# Patient Record
Sex: Female | Born: 1980 | Race: White | Hispanic: No | Marital: Married | Smoking: Never smoker
Health system: Southern US, Community
[De-identification: ages and names within clinical notes are randomized; demographics above are authoritative.]

## PROBLEM LIST (undated history)

## (undated) DIAGNOSIS — I491 Atrial premature depolarization: Secondary | ICD-10-CM

## (undated) DIAGNOSIS — R002 Palpitations: Secondary | ICD-10-CM

## (undated) DIAGNOSIS — F411 Generalized anxiety disorder: Secondary | ICD-10-CM

## (undated) DIAGNOSIS — I493 Ventricular premature depolarization: Secondary | ICD-10-CM

## (undated) DIAGNOSIS — E079 Disorder of thyroid, unspecified: Secondary | ICD-10-CM

## (undated) DIAGNOSIS — A609 Anogenital herpesviral infection, unspecified: Secondary | ICD-10-CM

## (undated) DIAGNOSIS — Z789 Other specified health status: Secondary | ICD-10-CM

## (undated) DIAGNOSIS — Z8619 Personal history of other infectious and parasitic diseases: Secondary | ICD-10-CM

## (undated) HISTORY — DX: Atrial premature depolarization: I49.1

## (undated) HISTORY — DX: Ventricular premature depolarization: I49.3

## (undated) HISTORY — DX: Generalized anxiety disorder: F41.1

## (undated) HISTORY — DX: Palpitations: R00.2

---

## 2013-01-08 ENCOUNTER — Ambulatory Visit: Payer: BC Managed Care – PPO | Admitting: Physician Assistant

## 2013-01-08 VITALS — BP 112/60 | HR 61 | Temp 98.0°F | Resp 16 | Ht 62.0 in | Wt 125.0 lb

## 2013-01-08 DIAGNOSIS — R350 Frequency of micturition: Secondary | ICD-10-CM

## 2013-01-08 DIAGNOSIS — R300 Dysuria: Secondary | ICD-10-CM

## 2013-01-08 LAB — POCT URINALYSIS DIPSTICK
Bilirubin, UA: NEGATIVE
Glucose, UA: NEGATIVE
Ketones, UA: NEGATIVE
Nitrite, UA: NEGATIVE
Protein, UA: NEGATIVE
Spec Grav, UA: 1.01
Urobilinogen, UA: 0.2
pH, UA: 6.5

## 2013-01-08 LAB — POCT UA - MICROSCOPIC ONLY
Crystals, Ur, HPF, POC: NEGATIVE
Mucus, UA: NEGATIVE
Yeast, UA: NEGATIVE

## 2013-01-08 MED ORDER — PHENAZOPYRIDINE HCL 200 MG PO TABS: 200.0000 mg | ORAL_TABLET | Freq: Three times a day (TID) | ORAL | Status: DC | PRN

## 2013-01-08 MED ORDER — CIPROFLOXACIN HCL 500 MG PO TABS: 500.0000 mg | ORAL_TABLET | Freq: Two times a day (BID) | ORAL | Status: DC

## 2013-01-08 NOTE — Progress Notes (Signed)
Patient ID: Ashley Mendez MRN: [id number], DOB: [birth year], 32 y.o. Date of Encounter: 01/08/2013, 8:50 AM  Primary Physician: No primary provider on file.  Chief Complaint: urinary frequency and dysuria  HPI: 32 y.o. year old female with presents with 1 day history of urinary frequency, dysuria, suprapubic pressure, and slight nausea. Symptoms started acutely in the middle of the night last night and have progressively worsened. Denies flank pain, vomiting, fever, chills, vaginal discharge, or odor. Has tried cranberry juice and ibuprofen which has helped some. LNMP 12/30/12. Patient is otherwise doing well without issues or complaints.  History reviewed. No pertinent past medical history.   Home Meds: Prior to Admission medications   Medication Sig Start Date End Date Taking? Authorizing Provider  etonogestrel-ethinyl estradiol (NUVARING) 0.12-0.015 MG/24HR vaginal ring Place 1 each vaginally every 28 (twenty-eight) days. Insert vaginally and leave in place for 3 consecutive weeks, then remove for 1 week.   Yes Historical Provider, MD  ciprofloxacin (CIPRO) 500 MG tablet Take 1 tablet (500 mg total) by mouth 2 (two) times daily. 01/08/13   Nelva Nay, PA-C  phenazopyridine (PYRIDIUM) 200 MG tablet Take 1 tablet (200 mg total) by mouth 3 (three) times daily as needed for pain. 01/08/13   Nelva Nay, PA-C    Allergies: No Known Allergies  History   Social History  . Marital Status: Single    Spouse Name: N/A    Number of Children: N/A  . Years of Education: N/A   Occupational History  . Not on file.   Social History Main Topics  . Smoking status: Never Smoker   . Smokeless tobacco: Not on file  . Alcohol Use: Yes  . Drug Use: No  . Sexual Activity: Yes   Other Topics Concern  . Not on file   Social History Narrative  . No narrative on file     Review of Systems: Constitutional: negative for chills, fever, night sweats, weight changes, or fatigue  HEENT:  negative for vision changes, hearing loss, congestion, rhinorrhea, ST, epistaxis, or sinus pressure Cardiovascular: negative for chest pain or palpitations Respiratory: negative for cough, hemoptysis, wheezing, shortness of breath. Abdominal: positive for suprapubic abdominal pain,   No nausea, vomiting, diarrhea, or constipation Genitourinary: positive for urinary frequency and dysuria. Negative for vaginal discharge, odor, or pelvic pain.  Dermatological: negative for rashes. Neurologic: negative for headache, dizziness, or syncope   Physical Exam: Blood pressure 112/60, pulse 61, temperature 98 F (36.7 C), temperature source Oral, resp. rate 16, height 5\' 2"  (1.575 m), weight 125 lb (56.7 kg), last menstrual period 12/30/2012, SpO2 100.00%., Body mass index is 22.86 kg/(m^2). General: Well developed, well nourished, in no acute distress. Head: Normocephalic, atraumatic, eyes without discharge, sclera non-icteric, nares are without discharge. External ear normal in appearance. Neck: Supple. No thyromegaly. Full ROM. No lymphadenopathy. Lungs: Clear bilaterally to auscultation without wheezes, rales, or rhonchi. Breathing is unlabored. Heart: RRR with S1 S2. No murmurs, rubs, or gallops appreciated. Abdominal: +BS x 4. No hepatosplenomegaly, rebound tenderness, or guarding. Positive suprapubic tenderness. No CVA tenderness bilaterally.  Msk:  Strength and tone normal for age. Extremities/Skin: Warm and dry. No clubbing or cyanosis. No edema.  Neuro: Alert and oriented X 3. Moves all extremities spontaneously. Gait is normal. CNII-XII grossly in tact. Psych:  Responds to questions appropriately with a normal affect.   Labs: Results for orders placed in visit on 01/08/13  POCT UA - MICROSCOPIC ONLY      Result Value Range  WBC, Ur, HPF, POC 2-12     RBC, urine, microscopic 0-4     Bacteria, U Microscopic trace     Mucus, UA neg     Epithelial cells, urine per micros 0-3     Crystals,  Ur, HPF, POC neg     Casts, Ur, LPF, POC WBC     Yeast, UA neg    POCT URINALYSIS DIPSTICK      Result Value Range   Color, UA clear     Clarity, UA clear     Glucose, UA neg     Bilirubin, UA neg     Ketones, UA neg     Spec Grav, UA 1.010     Blood, UA small     pH, UA 6.5     Protein, UA neg     Urobilinogen, UA 0.2     Nitrite, UA neg     Leukocytes, UA small (1+)       ASSESSMENT AND PLAN:  32 y.o. year old female with urinary tract infection Urine culture sent Increase fluids Cipro 500 mg bid x 5 days Pyridium 200 mg tid prn pain Follow up if symptoms worsen or fail to improve.  Grier Mitts, PA-C 01/08/2013 8:50 AM

## 2013-01-09 LAB — URINE CULTURE
Colony Count: NO GROWTH
Organism ID, Bacteria: NO GROWTH

## 2013-01-10 ENCOUNTER — Telehealth: Payer: Self-pay

## 2013-01-10 NOTE — Telephone Encounter (Signed)
Pt is returning call. Call back at [phone].

## 2013-01-12 NOTE — Telephone Encounter (Signed)
Notes Recorded by Ericka Pontiff on 01/10/2013 at 3:19 PM Left message for patient to call back. ------  Notes Recorded by Nelva Nay, PA-C on 01/09/2013 at 12:53 PM Please call patient and let her know culture did not show any growth, recommend follow up if symptoms persist. Called her, left another mssg, see lab.

## 2013-01-22 ENCOUNTER — Telehealth: Payer: Self-pay | Admitting: Emergency Medicine

## 2013-01-22 ENCOUNTER — Ambulatory Visit (INDEPENDENT_AMBULATORY_CARE_PROVIDER_SITE_OTHER): Payer: BC Managed Care – PPO | Admitting: Emergency Medicine

## 2013-01-22 ENCOUNTER — Ambulatory Visit: Payer: BC Managed Care – PPO

## 2013-01-22 VITALS — BP 102/50 | HR 64 | Temp 98.7°F | Resp 16 | Ht 61.75 in | Wt 121.2 lb

## 2013-01-22 DIAGNOSIS — R300 Dysuria: Secondary | ICD-10-CM

## 2013-01-22 DIAGNOSIS — N390 Urinary tract infection, site not specified: Secondary | ICD-10-CM | POA: Insufficient documentation

## 2013-01-22 LAB — POCT UA - MICROSCOPIC ONLY
Casts, Ur, LPF, POC: NEGATIVE
Crystals, Ur, HPF, POC: NEGATIVE
Mucus, UA: NEGATIVE
Yeast, UA: NEGATIVE

## 2013-01-22 LAB — POCT URINALYSIS DIPSTICK
Bilirubin, UA: NEGATIVE
Blood, UA: NEGATIVE
Glucose, UA: 100
Ketones, UA: NEGATIVE
Nitrite, UA: POSITIVE
Protein, UA: NEGATIVE
Spec Grav, UA: 1.01
Urobilinogen, UA: 1
pH, UA: 7

## 2013-01-22 LAB — POCT URINE PREGNANCY: Preg Test, Ur: NEGATIVE

## 2013-01-22 MED ORDER — CIPROFLOXACIN HCL 500 MG PO TABS: 500.0000 mg | ORAL_TABLET | Freq: Two times a day (BID) | ORAL | Status: DC

## 2013-01-22 MED ORDER — CIPROFLOXACIN HCL 250 MG PO TABS
500.0000 mg | ORAL_TABLET | Freq: Once | ORAL | Status: AC
  Administered 2013-01-22: 500 mg via ORAL

## 2013-01-22 NOTE — Progress Notes (Signed)
  Subjective:    Patient ID: Ashley Mendez, female    DOB: 1980/07/20, 32 y.o.   MRN: [id number]  HPIPatient was seen August 30 for dysuria. Was given Cipro. Symptoms have returned. New sexual activity with partner seem to be when it started. Urine Cx from Aug. 30th did not grow anything. Patient states chills, nausea, lower back pain and a little blood in urine, urgency. Patient has a Paediatric nurse in place.    Review of Systems     Objective:   Physical Exam chest is clear there is no CVA pain abdomen is soft and nontender on the left side there is no suprapubic tenderness there is minimal deep right adnexal tenderness  Results for orders placed in visit on 01/22/13  POCT URINALYSIS DIPSTICK      Result Value Range   Color, UA orange     Clarity, UA sl cloudy     Glucose, UA 100     Bilirubin, UA neg     Ketones, UA neg     Spec Grav, UA 1.010     Blood, UA neg     pH, UA 7.0     Protein, UA neg     Urobilinogen, UA 1.0     Nitrite, UA positive     Leukocytes, UA small (1+)    POCT UA - MICROSCOPIC ONLY      Result Value Range   WBC, Ur, HPF, POC 2-8     RBC, urine, microscopic 0-1     Bacteria, U Microscopic trace     Mucus, UA neg     Epithelial cells, urine per micros 0-1     Crystals, Ur, HPF, POC neg     Casts, Ur, LPF, POC neg     Yeast, UA neg    POCT URINE PREGNANCY      Result Value Range   Preg Test, Ur Negative     [facility] reading (PRIMARY) by  Dr. Cleta Alberts no stones were seen no abnormalities noted       Assessment & Plan:  We'll go ahead and refer her to urology and get their opinion regarding her recurrent cystitis. I'm concerned about interstitial cystitis. She does respond immediately to antibiotics. She was given one dose of Cipro in the office we'll treat for 3 days the urine culture results

## 2013-01-22 NOTE — Progress Notes (Deleted)
  Subjective:    Patient ID: Ashley Mendez, female    DOB: 1980-06-25, 32 y.o.   MRN: [id number]  HPIGave patient 250 mg Cipro.    Review of Systems     Objective:   Physical Exam        Assessment & Plan:

## 2013-01-22 NOTE — Telephone Encounter (Signed)
I left a message on patient's telephone that there may be a tiny  stone present in the left hemipelvis. The radiologist was not sure he felt like it was probably a phlebolith but felt if she was continuing to have symptoms she may need a  CT. I left a message on the patients answering machine regarding this and she can call tomorrow with a status check

## 2013-01-23 LAB — URINE CULTURE: Colony Count: 4000

## 2014-09-29 LAB — OB RESULTS CONSOLE GC/CHLAMYDIA
Chlamydia: NEGATIVE
Gonorrhea: NEGATIVE

## 2014-09-29 LAB — OB RESULTS CONSOLE HEPATITIS B SURFACE ANTIGEN: Hepatitis B Surface Ag: NEGATIVE

## 2014-09-29 LAB — OB RESULTS CONSOLE RPR: RPR: NONREACTIVE

## 2014-09-29 LAB — OB RESULTS CONSOLE GBS: GBS: POSITIVE

## 2014-09-29 LAB — OB RESULTS CONSOLE HIV ANTIBODY (ROUTINE TESTING): HIV: NONREACTIVE

## 2014-09-29 LAB — OB RESULTS CONSOLE RUBELLA ANTIBODY, IGM: Rubella: IMMUNE

## 2014-12-05 ENCOUNTER — Encounter (HOSPITAL_COMMUNITY): Payer: Self-pay

## 2014-12-05 ENCOUNTER — Inpatient Hospital Stay (HOSPITAL_COMMUNITY)
Admission: AD | Admit: 2014-12-05 | Discharge: 2014-12-08 | DRG: 766 | Disposition: A | Discharge status: Home / Self Care | Payer: BLUE CROSS/BLUE SHIELD | Source: Ambulatory Visit | Attending: Obstetrics and Gynecology | Admitting: Obstetrics and Gynecology

## 2014-12-05 ENCOUNTER — Inpatient Hospital Stay (HOSPITAL_COMMUNITY): Payer: BLUE CROSS/BLUE SHIELD | Admitting: Certified Registered Nurse Anesthetist

## 2014-12-05 DIAGNOSIS — O4292 Full-term premature rupture of membranes, unspecified as to length of time between rupture and onset of labor: Secondary | ICD-10-CM | POA: Diagnosis present

## 2014-12-05 DIAGNOSIS — Z3A40 40 weeks gestation of pregnancy: Secondary | ICD-10-CM | POA: Diagnosis present

## 2014-12-05 DIAGNOSIS — O09513 Supervision of elderly primigravida, third trimester: Secondary | ICD-10-CM | POA: Diagnosis not present

## 2014-12-05 DIAGNOSIS — O99824 Streptococcus B carrier state complicating childbirth: Secondary | ICD-10-CM | POA: Diagnosis present

## 2014-12-05 LAB — CBC
HCT: 36.4 % (ref 36.0–46.0)
Hemoglobin: 12.7 g/dL (ref 12.0–15.0)
MCH: 32 pg (ref 26.0–34.0)
MCHC: 34.9 g/dL (ref 30.0–36.0)
MCV: 91.7 fL (ref 78.0–100.0)
Platelets: 195 10*3/uL (ref 150–400)
RBC: 3.97 MIL/uL (ref 3.87–5.11)
RDW: 13.9 % (ref 11.5–15.5)
WBC: 11.3 10*3/uL — ABNORMAL HIGH (ref 4.0–10.5)

## 2014-12-05 LAB — ABO/RH: ABO/RH(D): O POS

## 2014-12-05 LAB — RPR: RPR Ser Ql: NONREACTIVE

## 2014-12-05 LAB — TYPE AND SCREEN
ABO/RH(D): O POS
Antibody Screen: NEGATIVE

## 2014-12-05 MED ORDER — FENTANYL 2.5 MCG/ML BUPIVACAINE 1/10 % EPIDURAL INFUSION (WH - ANES)
14.0000 mL/h | INTRAMUSCULAR | Status: DC | PRN
  Administered 2014-12-05 – 2014-12-06 (×4): 14 mL/h via EPIDURAL
  Filled 2014-12-05 (×3): qty 125

## 2014-12-05 MED ORDER — LIDOCAINE HCL (PF) 1 % IJ SOLN: 30.0000 mL | INTRAMUSCULAR | Status: DC | PRN

## 2014-12-05 MED ORDER — ACETAMINOPHEN 325 MG PO TABS: 650.0000 mg | ORAL_TABLET | ORAL | Status: DC | PRN

## 2014-12-05 MED ORDER — LIDOCAINE HCL (PF) 1 % IJ SOLN
INTRAMUSCULAR | Status: DC | PRN
  Administered 2014-12-05 (×2): 8 mL via EPIDURAL

## 2014-12-05 MED ORDER — PHENYLEPHRINE 40 MCG/ML (10ML) SYRINGE FOR IV PUSH (FOR BLOOD PRESSURE SUPPORT)
PREFILLED_SYRINGE | INTRAVENOUS | Status: AC
  Filled 2014-12-05: qty 20

## 2014-12-05 MED ORDER — LACTATED RINGERS IV SOLN
INTRAVENOUS | Status: DC
  Administered 2014-12-06 (×2): via INTRAUTERINE

## 2014-12-05 MED ORDER — FENTANYL 2.5 MCG/ML BUPIVACAINE 1/10 % EPIDURAL INFUSION (WH - ANES)
INTRAMUSCULAR | Status: AC
  Filled 2014-12-05: qty 125

## 2014-12-05 MED ORDER — TERBUTALINE SULFATE 1 MG/ML IJ SOLN
0.2500 mg | Freq: Once | INTRAMUSCULAR | Status: AC | PRN
Start: 2014-12-05 — End: 2014-12-05

## 2014-12-05 MED ORDER — EPHEDRINE 5 MG/ML INJ
10.0000 mg | INTRAVENOUS | Status: DC | PRN
Start: 2014-12-05 — End: 2014-12-06

## 2014-12-05 MED ORDER — OXYTOCIN 40 UNITS IN LACTATED RINGERS INFUSION - SIMPLE MED
1.0000 m[IU]/min | INTRAVENOUS | Status: DC
  Administered 2014-12-05: 2 m[IU]/min via INTRAVENOUS
  Filled 2014-12-05: qty 1000

## 2014-12-05 MED ORDER — OXYTOCIN 40 UNITS IN LACTATED RINGERS INFUSION - SIMPLE MED: 62.5000 mL/h | INTRAVENOUS | Status: DC

## 2014-12-05 MED ORDER — PENICILLIN G POTASSIUM 5000000 UNITS IJ SOLR
2.5000 10*6.[IU] | INTRAVENOUS | Status: DC
  Administered 2014-12-05 – 2014-12-06 (×7): 2.5 10*6.[IU] via INTRAVENOUS
  Filled 2014-12-05 (×9): qty 2.5

## 2014-12-05 MED ORDER — FLEET ENEMA 7-19 GM/118ML RE ENEM: 1.0000 | ENEMA | RECTAL | Status: DC | PRN

## 2014-12-05 MED ORDER — OXYTOCIN BOLUS FROM INFUSION: 500.0000 mL | INTRAVENOUS | Status: DC

## 2014-12-05 MED ORDER — DIPHENHYDRAMINE HCL 50 MG/ML IJ SOLN: 12.5000 mg | INTRAMUSCULAR | Status: DC | PRN

## 2014-12-05 MED ORDER — LACTATED RINGERS IV SOLN
500.0000 mL | INTRAVENOUS | Status: DC | PRN
  Administered 2014-12-05: 500 mL via INTRAVENOUS

## 2014-12-05 MED ORDER — OXYCODONE-ACETAMINOPHEN 5-325 MG PO TABS: 1.0000 | ORAL_TABLET | ORAL | Status: DC | PRN

## 2014-12-05 MED ORDER — ONDANSETRON HCL 4 MG/2ML IJ SOLN: 4.0000 mg | Freq: Four times a day (QID) | INTRAMUSCULAR | Status: DC | PRN

## 2014-12-05 MED ORDER — OXYCODONE-ACETAMINOPHEN 5-325 MG PO TABS: 2.0000 | ORAL_TABLET | ORAL | Status: DC | PRN

## 2014-12-05 MED ORDER — LACTATED RINGERS IV SOLN
INTRAVENOUS | Status: DC
  Administered 2014-12-05 – 2014-12-06 (×5): via INTRAVENOUS

## 2014-12-05 MED ORDER — PENICILLIN G POTASSIUM 5000000 UNITS IJ SOLR
5.0000 10*6.[IU] | Freq: Once | INTRAMUSCULAR | Status: AC
  Administered 2014-12-05: 5 10*6.[IU] via INTRAVENOUS
  Filled 2014-12-05: qty 5

## 2014-12-05 MED ORDER — CITRIC ACID-SODIUM CITRATE 334-500 MG/5ML PO SOLN
30.0000 mL | ORAL | Status: DC | PRN
  Administered 2014-12-06: 30 mL via ORAL
  Filled 2014-12-05 (×2): qty 15

## 2014-12-05 MED ORDER — PHENYLEPHRINE 40 MCG/ML (10ML) SYRINGE FOR IV PUSH (FOR BLOOD PRESSURE SUPPORT)
80.0000 ug | PREFILLED_SYRINGE | INTRAVENOUS | Status: DC | PRN
  Administered 2014-12-05: 80 ug via INTRAVENOUS

## 2014-12-05 NOTE — MAU Note (Signed)
Pt to be admitted with pitocin orders, PCN per protocol for +GBS.

## 2014-12-05 NOTE — Anesthesia Procedure Notes (Signed)
Epidural Patient location during procedure: OB Start time: 12/05/2014 5:48 PM End time: 12/05/2014 5:54 PM  Staffing Anesthesiologist: Lyn Hollingshead Performed by: anesthesiologist   Preanesthetic Checklist Completed: patient identified, surgical consent, pre-op evaluation, timeout performed, IV checked, risks and benefits discussed and monitors and equipment checked  Epidural Patient position: sitting Prep: site prepped and draped and DuraPrep Patient monitoring: continuous pulse ox and blood pressure Approach: midline Location: L3-L4 Injection technique: LOR air  Needle:  Needle type: Tuohy  Needle gauge: 17 G Needle length: 9 cm and 9 Needle insertion depth: 5 cm cm Catheter type: closed end flexible Catheter size: 19 Gauge Catheter at skin depth: 10 cm Test dose: negative and Other  Assessment Sensory level: T9 Events: blood not aspirated, injection not painful, no injection resistance, negative IV test and paresthesia  Additional Notes Lleg X 2Reason for block:procedure for pain

## 2014-12-05 NOTE — Progress Notes (Signed)
CTSP- had an episode of bleeding that was >200 cc.  FHTs 140-150s, g STV with accels but now persistent late decels.  SVE now  8/C/0; scant blood with mostly clear fluid on ballotting the head.  FHTs now 140s with NST R and no more decels.    Will continue for now.

## 2014-12-05 NOTE — MAU Note (Signed)
Pt asking to not start pitocin IV until doula arrives. Pt understands that she will need to go to L&D first to have this started and there is time for doula to arrive.

## 2014-12-05 NOTE — MAU Note (Signed)
Pt c/o some trickling of fluid since 0200, clear. Irregular contractions and cramping. +FM. +GBS

## 2014-12-05 NOTE — Progress Notes (Signed)
Pt with epidural now.  Had episode of bradycardia just after.  Now FHTs 130s, gSTV, NST R; with occ moderate variables and some in late presentation.  SVE 6/C/-2, IUPC placed.   Continue; expect variables to clear with BP normalizing.

## 2014-12-05 NOTE — Progress Notes (Signed)
IV restarted at 1235 by CRNA after 9 previously unsuccessful attempts

## 2014-12-05 NOTE — H&P (Signed)
34 y.o. [redacted]w[redacted]d  G1P0 comes in c/o clear LOF since 0200.  Otherwise has good fetal movement and no bleeding.  History reviewed. No pertinent past medical history. History reviewed. No pertinent past surgical history.  OB History  Gravida Para Term Preterm AB SAB TAB Ectopic Multiple Living  1             # Outcome Date GA Lbr Len/2nd Weight Sex Delivery Anes PTL Lv  1 Current               History   Social History  . Marital Status: Married    Spouse Name: N/A  . Number of Children: N/A  . Years of Education: N/A   Occupational History  . Not on file.   Social History Main Topics  . Smoking status: Never Smoker   . Smokeless tobacco: Not on file  . Alcohol Use: Yes  . Drug Use: No  . Sexual Activity:    Partners: Male   Other Topics Concern  . Not on file   Social History Narrative   Review of patient's allergies indicates no known allergies.    Prenatal Transfer Tool  Maternal Diabetes: No Genetic Screening: Normal Maternal Ultrasounds/Referrals: Normal Fetal Ultrasounds or other Referrals:  None Maternal Substance Abuse:  No Significant Maternal Medications:  None Significant Maternal Lab Results: None  Other PNC: uncomplicated.    Filed Vitals:   12/05/14 0707  BP:   Pulse:   Temp: 97.9 F (36.6 C)  Resp: 18     Lungs/Cor:  NAD Abdomen:  soft, gravid Ex:  no cords, erythema SVE:  1/30/high; SROM clear SSE: neg for lesions FHTs:  120, good STV, NST R Toco:  q occ  [place] bedside reveals vtx presentation.  A/P   Term PROM.  GBS pos- PCN given.  Biagio Snelson A

## 2014-12-05 NOTE — Anesthesia Preprocedure Evaluation (Addendum)
Anesthesia Evaluation  Patient identified by MRN, date of birth, ID band Patient awake    Reviewed: Allergy & Precautions, H&P , NPO status , Patient's Chart, lab work & pertinent test results  Airway Mallampati: I  TM Distance: >3 FB Neck ROM: full    Dental no notable dental hx.    Pulmonary neg pulmonary ROS,    Pulmonary exam normal       Cardiovascular negative cardio ROS Normal cardiovascular exam    Neuro/Psych negative neurological ROS  negative psych ROS   GI/Hepatic negative GI ROS, Neg liver ROS,   Endo/Other  negative endocrine ROS  Renal/GU negative Renal ROS     Musculoskeletal   Abdominal Normal abdominal exam  (+)   Peds  Hematology negative hematology ROS (+)   Anesthesia Other Findings   Reproductive/Obstetrics (+) Pregnancy                             Anesthesia Physical Anesthesia Plan  ASA: II and emergent  Anesthesia Plan: Epidural   Post-op Pain Management:    Induction:   Airway Management Planned:   Additional Equipment:   Intra-op Plan:   Post-operative Plan:   Informed Consent: I have reviewed the patients History and Physical, chart, labs and discussed the procedure including the risks, benefits and alternatives for the proposed anesthesia with the patient or authorized representative who has indicated his/her understanding and acceptance.     Plan Discussed with: CRNA, Anesthesiologist and Surgeon  Anesthesia Plan Comments: (C/section for failure to progress. Will use epidural for C/section. M. Royce Macadamia, MD)       Anesthesia Quick Evaluation

## 2014-12-05 NOTE — Progress Notes (Signed)
Reactive tracing no variables, U/s off for epidural

## 2014-12-06 ENCOUNTER — Encounter (HOSPITAL_COMMUNITY): Payer: Self-pay | Admitting: Registered Nurse

## 2014-12-06 ENCOUNTER — Encounter (HOSPITAL_COMMUNITY)
Admission: AD | Disposition: A | Discharge status: Home / Self Care | Payer: Self-pay | Source: Ambulatory Visit | Attending: Obstetrics and Gynecology

## 2014-12-06 SURGERY — Surgical Case
Anesthesia: Epidural

## 2014-12-06 MED ORDER — SODIUM CHLORIDE 0.9 % IJ SOLN
3.0000 mL | INTRAMUSCULAR | Status: DC | PRN
Start: 2014-12-06 — End: 2014-12-08

## 2014-12-06 MED ORDER — KETOROLAC TROMETHAMINE 30 MG/ML IJ SOLN
30.0000 mg | Freq: Four times a day (QID) | INTRAMUSCULAR | Status: AC | PRN
  Administered 2014-12-06: 30 mg via INTRAMUSCULAR

## 2014-12-06 MED ORDER — LIDOCAINE-EPINEPHRINE (PF) 2 %-1:200000 IJ SOLN
INTRAMUSCULAR | Status: AC
  Filled 2014-12-06: qty 20

## 2014-12-06 MED ORDER — NALBUPHINE HCL 10 MG/ML IJ SOLN: 5.0000 mg | INTRAMUSCULAR | Status: DC | PRN

## 2014-12-06 MED ORDER — LACTATED RINGERS IV SOLN
INTRAVENOUS | Status: DC | PRN
  Administered 2014-12-06: 13:00:00 via INTRAVENOUS

## 2014-12-06 MED ORDER — SODIUM CHLORIDE 0.9 % IR SOLN
Status: DC | PRN
  Administered 2014-12-06: 1000 mL

## 2014-12-06 MED ORDER — SODIUM BICARBONATE 8.4 % IV SOLN
INTRAVENOUS | Status: DC | PRN
  Administered 2014-12-06 (×3): 5 mL via EPIDURAL

## 2014-12-06 MED ORDER — PHENYLEPHRINE HCL 10 MG/ML IJ SOLN
INTRAMUSCULAR | Status: DC | PRN
  Administered 2014-12-06: 80 ug via INTRAVENOUS
  Administered 2014-12-06: 40 ug via INTRAVENOUS

## 2014-12-06 MED ORDER — IBUPROFEN 600 MG PO TABS
600.0000 mg | ORAL_TABLET | Freq: Four times a day (QID) | ORAL | Status: DC
  Administered 2014-12-07 – 2014-12-08 (×7): 600 mg via ORAL
  Filled 2014-12-06 (×7): qty 1

## 2014-12-06 MED ORDER — SCOPOLAMINE 1 MG/3DAYS TD PT72
MEDICATED_PATCH | TRANSDERMAL | Status: AC
  Filled 2014-12-06: qty 1

## 2014-12-06 MED ORDER — TETANUS-DIPHTH-ACELL PERTUSSIS 5-2.5-18.5 LF-MCG/0.5 IM SUSP: 0.5000 mL | Freq: Once | INTRAMUSCULAR | Status: DC

## 2014-12-06 MED ORDER — ONDANSETRON HCL 4 MG/2ML IJ SOLN
INTRAMUSCULAR | Status: DC | PRN
  Administered 2014-12-06: 4 mg via INTRAVENOUS

## 2014-12-06 MED ORDER — NALOXONE HCL 1 MG/ML IJ SOLN
1.0000 ug/kg/h | INTRAMUSCULAR | Status: DC | PRN
  Filled 2014-12-06: qty 2

## 2014-12-06 MED ORDER — MEPERIDINE HCL 25 MG/ML IJ SOLN
INTRAMUSCULAR | Status: AC
  Filled 2014-12-06: qty 1

## 2014-12-06 MED ORDER — MORPHINE SULFATE (PF) 0.5 MG/ML IJ SOLN
INTRAMUSCULAR | Status: DC | PRN
  Administered 2014-12-06: 3 mg via EPIDURAL

## 2014-12-06 MED ORDER — SODIUM BICARBONATE 8.4 % IV SOLN
INTRAVENOUS | Status: AC
Start: 2014-12-06 — End: 2014-12-06
  Filled 2014-12-06: qty 50

## 2014-12-06 MED ORDER — ONDANSETRON HCL 4 MG/2ML IJ SOLN
INTRAMUSCULAR | Status: AC
  Filled 2014-12-06: qty 2

## 2014-12-06 MED ORDER — OXYCODONE-ACETAMINOPHEN 5-325 MG PO TABS
2.0000 | ORAL_TABLET | ORAL | Status: DC | PRN
Start: 2014-12-06 — End: 2014-12-08

## 2014-12-06 MED ORDER — SIMETHICONE 80 MG PO CHEW: 80.0000 mg | CHEWABLE_TABLET | ORAL | Status: DC | PRN

## 2014-12-06 MED ORDER — OXYCODONE-ACETAMINOPHEN 5-325 MG PO TABS: 1.0000 | ORAL_TABLET | ORAL | Status: DC | PRN

## 2014-12-06 MED ORDER — CEFAZOLIN SODIUM-DEXTROSE 2-3 GM-% IV SOLR
INTRAVENOUS | Status: AC
Start: 2014-12-06 — End: 2014-12-06
  Filled 2014-12-06: qty 50

## 2014-12-06 MED ORDER — NALBUPHINE HCL 10 MG/ML IJ SOLN: 5.0000 mg | Freq: Once | INTRAMUSCULAR | Status: AC | PRN

## 2014-12-06 MED ORDER — CEFAZOLIN SODIUM-DEXTROSE 2-3 GM-% IV SOLR
INTRAVENOUS | Status: DC | PRN
  Administered 2014-12-06: 2 g via INTRAVENOUS

## 2014-12-06 MED ORDER — SIMETHICONE 80 MG PO CHEW
80.0000 mg | CHEWABLE_TABLET | ORAL | Status: DC
  Administered 2014-12-07 – 2014-12-08 (×2): 80 mg via ORAL
  Filled 2014-12-06 (×2): qty 1

## 2014-12-06 MED ORDER — MEPERIDINE HCL 25 MG/ML IJ SOLN
INTRAMUSCULAR | Status: DC | PRN
  Administered 2014-12-06 (×2): 12.5 mg via INTRAVENOUS

## 2014-12-06 MED ORDER — KETOROLAC TROMETHAMINE 30 MG/ML IJ SOLN: 30.0000 mg | Freq: Four times a day (QID) | INTRAMUSCULAR | Status: AC | PRN

## 2014-12-06 MED ORDER — SCOPOLAMINE 1 MG/3DAYS TD PT72
MEDICATED_PATCH | TRANSDERMAL | Status: DC | PRN
  Administered 2014-12-06: 1 via TRANSDERMAL

## 2014-12-06 MED ORDER — MEPERIDINE HCL 25 MG/ML IJ SOLN: 6.2500 mg | INTRAMUSCULAR | Status: DC | PRN

## 2014-12-06 MED ORDER — ONDANSETRON HCL 4 MG/2ML IJ SOLN: 4.0000 mg | Freq: Three times a day (TID) | INTRAMUSCULAR | Status: DC | PRN

## 2014-12-06 MED ORDER — SENNOSIDES-DOCUSATE SODIUM 8.6-50 MG PO TABS
2.0000 | ORAL_TABLET | ORAL | Status: DC
  Administered 2014-12-07 – 2014-12-08 (×2): 2 via ORAL
  Filled 2014-12-06 (×2): qty 2

## 2014-12-06 MED ORDER — FENTANYL CITRATE (PF) 100 MCG/2ML IJ SOLN: 25.0000 ug | INTRAMUSCULAR | Status: DC | PRN

## 2014-12-06 MED ORDER — OXYTOCIN 10 UNIT/ML IJ SOLN
40.0000 [IU] | INTRAVENOUS | Status: DC | PRN
  Administered 2014-12-06: 40 [IU] via INTRAVENOUS

## 2014-12-06 MED ORDER — DIPHENHYDRAMINE HCL 25 MG PO CAPS: 25.0000 mg | ORAL_CAPSULE | Freq: Four times a day (QID) | ORAL | Status: DC | PRN

## 2014-12-06 MED ORDER — OXYTOCIN 10 UNIT/ML IJ SOLN
INTRAMUSCULAR | Status: AC
  Filled 2014-12-06: qty 4

## 2014-12-06 MED ORDER — SCOPOLAMINE 1 MG/3DAYS TD PT72
1.0000 | MEDICATED_PATCH | Freq: Once | TRANSDERMAL | Status: DC
  Filled 2014-12-06: qty 1

## 2014-12-06 MED ORDER — PHENYLEPHRINE 40 MCG/ML (10ML) SYRINGE FOR IV PUSH (FOR BLOOD PRESSURE SUPPORT)
PREFILLED_SYRINGE | INTRAVENOUS | Status: AC
  Filled 2014-12-06: qty 10

## 2014-12-06 MED ORDER — LANOLIN HYDROUS EX OINT: 1.0000 "application " | TOPICAL_OINTMENT | CUTANEOUS | Status: DC | PRN

## 2014-12-06 MED ORDER — HYDROMORPHONE HCL 1 MG/ML IJ SOLN
1.0000 mg | Freq: Once | INTRAMUSCULAR | Status: AC
  Administered 2014-12-06: 1 mg via INTRAVENOUS
  Filled 2014-12-06: qty 1

## 2014-12-06 MED ORDER — DIPHENHYDRAMINE HCL 25 MG PO CAPS: 25.0000 mg | ORAL_CAPSULE | ORAL | Status: DC | PRN

## 2014-12-06 MED ORDER — IBUPROFEN 600 MG PO TABS: 600.0000 mg | ORAL_TABLET | Freq: Four times a day (QID) | ORAL | Status: DC | PRN

## 2014-12-06 MED ORDER — ACETAMINOPHEN 325 MG PO TABS
650.0000 mg | ORAL_TABLET | ORAL | Status: DC | PRN
  Administered 2014-12-06: 650 mg via ORAL
  Filled 2014-12-06: qty 2

## 2014-12-06 MED ORDER — DIBUCAINE 1 % RE OINT: 1.0000 "application " | TOPICAL_OINTMENT | RECTAL | Status: DC | PRN

## 2014-12-06 MED ORDER — OXYTOCIN 40 UNITS IN LACTATED RINGERS INFUSION - SIMPLE MED: 62.5000 mL/h | INTRAVENOUS | Status: AC

## 2014-12-06 MED ORDER — DIPHENHYDRAMINE HCL 50 MG/ML IJ SOLN: 12.5000 mg | INTRAMUSCULAR | Status: DC | PRN

## 2014-12-06 MED ORDER — KETOROLAC TROMETHAMINE 30 MG/ML IJ SOLN
INTRAMUSCULAR | Status: AC
  Filled 2014-12-06: qty 1

## 2014-12-06 MED ORDER — MORPHINE SULFATE 0.5 MG/ML IJ SOLN
INTRAMUSCULAR | Status: AC
  Filled 2014-12-06: qty 10

## 2014-12-06 MED ORDER — PRENATAL MULTIVITAMIN CH
1.0000 | ORAL_TABLET | Freq: Every day | ORAL | Status: DC
  Administered 2014-12-07 – 2014-12-08 (×2): 1 via ORAL
  Filled 2014-12-06 (×2): qty 1

## 2014-12-06 MED ORDER — LACTATED RINGERS IV SOLN: INTRAVENOUS | Status: DC

## 2014-12-06 MED ORDER — SIMETHICONE 80 MG PO CHEW
80.0000 mg | CHEWABLE_TABLET | Freq: Three times a day (TID) | ORAL | Status: DC
  Administered 2014-12-06 – 2014-12-08 (×6): 80 mg via ORAL
  Filled 2014-12-06 (×6): qty 1

## 2014-12-06 MED ORDER — MENTHOL 3 MG MT LOZG: 1.0000 | LOZENGE | OROMUCOSAL | Status: DC | PRN

## 2014-12-06 MED ORDER — NALOXONE HCL 0.4 MG/ML IJ SOLN: 0.4000 mg | INTRAMUSCULAR | Status: DC | PRN

## 2014-12-06 MED ORDER — WITCH HAZEL-GLYCERIN EX PADS: 1.0000 "application " | MEDICATED_PAD | CUTANEOUS | Status: DC | PRN

## 2014-12-06 SURGICAL SUPPLY — 32 items
BENZOIN TINCTURE PRP APPL 2/3 (GAUZE/BANDAGES/DRESSINGS) ×2 IMPLANT
CLAMP CORD UMBIL (MISCELLANEOUS) IMPLANT
CLOTH BEACON ORANGE TIMEOUT ST (SAFETY) ×2 IMPLANT
DRAPE SHEET LG 3/4 BI-LAMINATE (DRAPES) IMPLANT
DRSG OPSITE POSTOP 4X10 (GAUZE/BANDAGES/DRESSINGS) ×2 IMPLANT
DURAPREP 26ML APPLICATOR (WOUND CARE) ×2 IMPLANT
ELECT REM PT RETURN 9FT ADLT (ELECTROSURGICAL) ×2
ELECTRODE REM PT RTRN 9FT ADLT (ELECTROSURGICAL) ×1 IMPLANT
EXTRACTOR VACUUM KIWI (MISCELLANEOUS) IMPLANT
GLOVE BIO SURGEON STRL SZ 6 (GLOVE) ×2 IMPLANT
GLOVE INDICATOR 6.5 STRL GRN (GLOVE) ×2 IMPLANT
GOWN STRL REUS W/TWL LRG LVL3 (GOWN DISPOSABLE) ×4 IMPLANT
KIT ABG SYR 3ML LUER SLIP (SYRINGE) IMPLANT
LIQUID BAND (GAUZE/BANDAGES/DRESSINGS) IMPLANT
NEEDLE HYPO 25X5/8 SAFETYGLIDE (NEEDLE) IMPLANT
NS IRRIG 1000ML POUR BTL (IV SOLUTION) ×2 IMPLANT
PACK C SECTION WH (CUSTOM PROCEDURE TRAY) ×2 IMPLANT
PAD OB MATERNITY 4.3X12.25 (PERSONAL CARE ITEMS) ×2 IMPLANT
RTRCTR C-SECT PINK 25CM LRG (MISCELLANEOUS) ×2 IMPLANT
STRIP CLOSURE SKIN 1/2X4 (GAUZE/BANDAGES/DRESSINGS) ×2 IMPLANT
SUT MNCRL 0 VIOLET CTX 36 (SUTURE) ×2 IMPLANT
SUT MNCRL AB 3-0 PS2 27 (SUTURE) ×2 IMPLANT
SUT MONOCRYL 0 CTX 36 (SUTURE) ×2
SUT PLAIN 0 NONE (SUTURE) IMPLANT
SUT PLAIN 2 0 (SUTURE) ×1
SUT PLAIN ABS 2-0 CT1 27XMFL (SUTURE) ×1 IMPLANT
SUT VIC AB 0 CTX 36 (SUTURE) ×2
SUT VIC AB 0 CTX36XBRD ANBCTRL (SUTURE) ×2 IMPLANT
SUT VIC AB 2-0 CT1 27 (SUTURE) ×1
SUT VIC AB 2-0 CT1 TAPERPNT 27 (SUTURE) ×1 IMPLANT
TOWEL OR 17X24 6PK STRL BLUE (TOWEL DISPOSABLE) ×2 IMPLANT
TRAY FOLEY CATH SILVER 14FR (SET/KITS/TRAYS/PACK) ×2 IMPLANT

## 2014-12-06 NOTE — Brief Op Note (Signed)
12/05/2014 - 12/06/2014  1:17 PM  PATIENT:  Ashley Mendez  34 y.o. female  PRE-OPERATIVE DIAGNOSIS:  Arrest of dilation  POST-OPERATIVE DIAGNOSIS:  Arrest of dilation  PROCEDURE:  Procedure(s): CESAREAN SECTION (N/A)  SURGEON:  Surgeon(s) and Role:    * Jerelyn Charles, MD - Primary  ANESTHESIA:   epidural  EBL:  Total I/O In: 2000 [I.V.:2000] Out: 900 [Urine:200; Blood:700]  BLOOD ADMINISTERED:none  DRAINS: none   LOCAL MEDICATIONS USED:  NONE  SPECIMEN:  No Specimen  DISPOSITION OF SPECIMEN:  N/A  COUNTS:  YES  TOURNIQUET:  * No tourniquets in log *  DICTATION: .Note written in EPIC  PLAN OF CARE: Admit to inpatient   PATIENT DISPOSITION:  PACU - hemodynamically stable.   Delay start of Pharmacological VTE agent (>24hrs) due to surgical blood loss or risk of bleeding: not applicable

## 2014-12-06 NOTE — Transfer of Care (Signed)
Immediate Anesthesia Transfer of Care Note  Patient: Ashley Mendez  Procedure(s) Performed: Procedure(s): CESAREAN SECTION (N/A)  Patient Location: PACU  Anesthesia Type:Epidural  Level of Consciousness: awake, alert  and oriented  Airway & Oxygen Therapy: Patient Spontanous Breathing  Post-op Assessment: Report given to RN  Post vital signs: Reviewed  Last Vitals:  Filed Vitals:   12/06/14 1131  BP: 97/71  Pulse: 116  Temp:   Resp: 18    Complications: No apparent anesthesia complications

## 2014-12-06 NOTE — Anesthesia Postprocedure Evaluation (Signed)
  Anesthesia Post-op Note  Patient: [name], advertising  Procedure(s) Performed: Procedure(s): CESAREAN SECTION (N/A)  Patient Location: PACU  Anesthesia Type:Epidural  Level of Consciousness: awake, alert  and oriented  Airway and Oxygen Therapy: Patient Spontanous Breathing  Post-op Pain: none  Post-op Assessment: Post-op Vital signs reviewed, Patient's Cardiovascular Status Stable, Respiratory Function Stable, Patent Airway, No signs of Nausea or vomiting, Pain level controlled, No headache, No backache, Spinal receding and Patient able to bend at knees LLE Motor Response: Purposeful movement LLE Sensation: Tingling RLE Motor Response: Purposeful movement RLE Sensation: Tingling      Post-op Vital Signs: Reviewed and stable  Last Vitals:  Filed Vitals:   12/06/14 1522  BP: 115/78  Pulse: 73  Temp: 37.2 C  Resp: 18    Complications: No apparent anesthesia complications

## 2014-12-06 NOTE — Op Note (Signed)
Cesarean Section Procedure Note  Pre-operative Diagnosis: 1. Intrauterine pregnancy at [redacted]w[redacted]d  2. Arrest of descent  Post-operative Diagnosis: same as above  Surgeon: Jerelyn Charles, MD  Procedure: Primary low transverse cesarean section   Anesthesia: Spinal anesthesia  Estimated Blood Loss: 700 mL         Drains: Foley catheter         Specimens: None         Implants: none         Complications:  None; patient tolerated the procedure well.         Disposition: PACU - hemodynamically stable.  Findings:  Normal uterus, tubes and ovaries bilaterally.  Viable female infant, 3414g (6lb 15oz) Apgars 8, 9.   OP presentation  Procedure Details   After epidural anesthesia was found to adequate , the patient was placed in the dorsal supine position with a leftward tilt, draped and prepped in the usual sterile manner. A Pfannenstiel incision was made and carried down through the subcutaneous tissue to the fascia. The fascia was incised in the midline and the fascial incision was extended laterally with Mayo scissors. The superior aspect of the fascial incision was grasped with two Kocher clamp, tented up and the rectus muscles dissected off sharply. The rectus was then dissected off with blunt dissection and Mayo scissors inferiorly. The rectus muscles were separated in the midline. The abdominal peritoneum was identified, tented up, entered bluntly, and the incision was extended superiorly and inferiorly with good visualization of the bladder. The Alexis retractor was deployed. The vesicouterine peritoneum was identified.  Scalpel was then used to make a low transverse incision on the uterus which was extended in the cephalad-caudad direction with blunt dissection. The fluid was clear. The fetal vertex was identified, elevated out of the pelvis and brought to the hysterotomy. The head was delivered easily followed by the shoulders and body. The cord was clamped and cut and the infant was passed  to the waiting neonatologist. Placenta was then delivered spontaneously, intact and appear normal, the uterus was cleared of all clot and debris   The hysterotomy was repaired with #0 Monocryl in running locked fashion. A second imbricating layer with #0 monocryl was placed.  The hysterotomy was reexamined and excellent hemostasis was noted.  The Alexis retractor was removed from the abdomen. The peritoneum was examined and all vessels noted to be hemostatic. The abdominal cavity was cleared of all clot and debris.  The peritoneum was closed with 2-0 vicryl in a running fashion. The fascia and rectus muscles were inspected and were hemostatic. The fascia was closed with 0 Vicryl in a running fashion. The subcuticular layer was irrigated and all bleeders cauterized.  The subcutaneous layer was re approximated with interrupted 3-0 plain gut.  The skin was closed with 3-0 monocryl in a subcuticular fashion. The incision was dressed with benzoine, steri strips and pressure dressing. All sponge lap and needle counts were correct x3. Patient tolerated the procedure well and recovered in stable condition following the procedure.

## 2014-12-06 NOTE — Progress Notes (Signed)
SVE still 9 cm.  Arrest of dilation.  I recommend proceeding to the OR for arrest of dilation.  Patient at this time is agreeable.  We discussed the risks of c/s to include, but not limited to, infection, bleeding, damage to surrounding structures including bowel, bladder, tubes, ovaries, nerves, vessels, need for blood transfusion, blood clot, need for additional procedures.  All questions answered, consent signed.

## 2014-12-06 NOTE — Progress Notes (Signed)
Comfortable w epidural  EFM: 150s, mod var, Cat 1 Toco:  q3 min SVE: 9/100/0  Protracted active phase.  Baby is OP and asynclitic.   Discussed likely need for cesarean section for arrest of dilation.   Patient not yet ready to proceed to OR.  Will reasses in 1.5-2 hr, if no change, to OR at that time.   FSR

## 2014-12-06 NOTE — Progress Notes (Signed)
In last 4 hours pt has had a few mild to moderate variables in the late presentation but in last two hours has had only one.  FHTs 130s, gSTV, NST R Toco q 5-8  Pitocin restarted.

## 2014-12-07 ENCOUNTER — Encounter (HOSPITAL_COMMUNITY): Payer: Self-pay | Admitting: Obstetrics

## 2014-12-07 LAB — CBC
HCT: 28.7 % — ABNORMAL LOW (ref 36.0–46.0)
Hemoglobin: 9.7 g/dL — ABNORMAL LOW (ref 12.0–15.0)
MCH: 31.6 pg (ref 26.0–34.0)
MCHC: 33.8 g/dL (ref 30.0–36.0)
MCV: 93.5 fL (ref 78.0–100.0)
Platelets: 144 10*3/uL — ABNORMAL LOW (ref 150–400)
RBC: 3.07 MIL/uL — ABNORMAL LOW (ref 3.87–5.11)
RDW: 14.5 % (ref 11.5–15.5)
WBC: 14.1 10*3/uL — ABNORMAL HIGH (ref 4.0–10.5)

## 2014-12-07 NOTE — Addendum Note (Signed)
Addendum  created 12/07/14 0801 by Talbot Grumbling, CRNA   Modules edited: Notes Section   Notes Section:  File: [id number]

## 2014-12-07 NOTE — Progress Notes (Signed)
Subjective: Postpartum Day 1: Cesarean Delivery Patient reports pain controlled, no nausea/vomiting, lochia appropriate  Objective: Vital signs in last 24 hours: Temp:  [97.4 F (36.3 C)-98.9 F (37.2 C)] 97.8 F (36.6 C) (07/28 0500) Pulse Rate:  [57-116] 60 (07/28 0500) Resp:  [10-19] 18 (07/28 0025) BP: (84-118)/(44-86) 84/57 mmHg (07/28 0500) SpO2:  [97 %-99 %] 99 % (07/28 0500)  Physical Exam:  General: alert, cooperative and appears stated age 34: appropriate Uterine Fundus: firm Incision: healing well DVT Evaluation: No evidence of DVT seen on physical exam.   Recent Labs  12/05/14 0400 12/07/14 0545  HGB 12.7 9.7*  HCT 36.4 28.7*    Assessment/Plan: Status post Cesarean section. Doing well postoperatively.  Continue current care.  Sabrea Sankey H. 12/07/2014, 8:50 AM

## 2014-12-07 NOTE — Anesthesia Postprocedure Evaluation (Signed)
Anesthesia Post Note  Patient: Ashley Mendez  Procedure(s) Performed: Procedure(s) (LRB): CESAREAN SECTION (N/A)  Anesthesia type: Epidural  Patient location: Mother/Baby  Post pain: Pain level controlled  Post assessment: Post-op Vital signs reviewed  Last Vitals:  Filed Vitals:   12/07/14 0500  BP: 84/57  Pulse: 60  Temp: 36.6 C  Resp:     Post vital signs: Reviewed  Level of consciousness: awake  Complications: No apparent anesthesia complications

## 2014-12-07 NOTE — Lactation Note (Signed)
This note was copied from the chart of Ashley Aletha Brockwell. Lactation Consultation Note; Mom had baby latched to right breast when I went into room. Using NS because left nipple is flat so using it on both breasts. Assisted mom with proper application of NS to left breast. Lots of Colostrum noted when baby came off the breast. Did try without NS on left but baby on and off the breast and would not stay latched. Encouraged to continue trying without NS. BF brochure given with resources for support after DC. No questions at present. To call for assist prn. Mom had to go to bathroom and will put baby back to breast when she comes out.   Patient Name: Ashley Mendez Today's Date: 12/07/2014 Reason for consult: Initial assessment   Maternal Data Formula Feeding for Exclusion: No Has patient been taught Hand Expression?: Yes Does the patient have breastfeeding experience prior to this delivery?: No  Feeding Feeding Type: Breast Fed Length of feed: 25 min  LATCH Score/Interventions Latch: Grasps breast easily, tongue down, lips flanged, rhythmical sucking.  Audible Swallowing: A few with stimulation  Type of Nipple: Flat  Comfort (Breast/Nipple): Soft / non-tender     Hold (Positioning): Assistance needed to correctly position infant at breast and maintain latch. Intervention(s): Breastfeeding basics reviewed  LATCH Score: 7  Lactation Tools Discussed/Used Tools: Nipple Shields Nipple shield size: 16   Consult Status Consult Status: Follow-up Date: 12/08/14 Follow-up type: In-patient    Truddie Crumble 12/07/2014, 12:18 PM

## 2014-12-08 NOTE — Lactation Note (Signed)
This note was copied from the chart of Ashley Antonette Lattin. Lactation Consultation Note  Follow up visit made.  Parents would like early discharge today.  Mom feels like feedings are going well using the nipple shield although sometimes uncomfortable. She states colostrum is present in shield after feeds.  Baby is acting frantic hungry currently.  Baby positioned in both football hold and side lying for feeding.  Mom feeling pinching with both the 16 mm and 20 mm nipple shield.  Attempted cross cradle without the shield but baby unable to latch.  Reviewed hand expression with mom in order to cup feed baby but only 1 drop expressed.  Mom feeling discomfort with hand expression.  Discussed if baby continued to act frantic hungry after cluster feeding a small amount of formula may be needed.  Mom states she understands.  If parents and baby do go home today I recommended they call for outpatient appointment if any challenges after discharge.  Comfort gels given with instructions.  Mom states baby had a 30 minute feeding late this AM and she had no pain.  I recommended baby feed again without difficulty of pain prior to discharge.  Patient Name: Ashley Mendez Today's Date: 12/08/2014     Maternal Data    Feeding Feeding Type: Breast Fed Length of feed: 10 min  LATCH Score/Interventions Latch: Repeated attempts needed to sustain latch, nipple held in mouth throughout feeding, stimulation needed to elicit sucking reflex.  Audible Swallowing: A few with stimulation  Type of Nipple: Everted at rest and after stimulation  Comfort (Breast/Nipple): Filling, red/small blisters or bruises, mild/mod discomfort     Hold (Positioning): Assistance needed to correctly position infant at breast and maintain latch.  LATCH Score: 6  Lactation Tools Discussed/Used     Consult Status      [name] 12/08/2014, 1:10 PM

## 2014-12-08 NOTE — Discharge Summary (Signed)
Obstetric Discharge Summary Reason for Admission: onset of labor Prenatal Procedures: ultrasound Intrapartum Procedures: cesarean: low cervical, transverse Postpartum Procedures: none Complications-Operative and Postpartum: none HEMOGLOBIN  Date Value Ref Range Status  12/07/2014 9.7* 12.0 - 15.0 g/dL Final   HCT  Date Value Ref Range Status  12/07/2014 28.7* 36.0 - 46.0 % Final    Physical Exam:  General: alert Lochia: appropriate Uterine Fundus: firm Incision: healing well DVT Evaluation: No evidence of DVT seen on physical exam.  Discharge Diagnoses: Term Pregnancy-delivered and Arrest of desent  Discharge Information: Date: 12/08/2014 Activity: pelvic rest Diet: routine Medications: PNV and Ibuprofen Condition: stable Instructions: refer to practice specific booklet Discharge to: home Follow-up Information    Follow up with [facility] GEFFEL Carlis Abbott, MD. Schedule an appointment as soon as possible for a visit in 1 month.   Specialty:  Obstetrics   Contact information:   [address] [place] [address] [phone]       Newborn Data: Live born female  Birth Weight: 6 lb 14.8 oz (3141 g) APGAR: 8, 9  Home with mother.  Sedalia Greeson E 12/08/2014, 9:48 AM

## 2014-12-08 NOTE — Progress Notes (Signed)
POD#2 Pt doing well Plan/ Routine care.

## 2015-06-05 LAB — OB RESULTS CONSOLE ABO/RH: RH Type: POSITIVE

## 2015-06-05 LAB — OB RESULTS CONSOLE RUBELLA ANTIBODY, IGM: Rubella: IMMUNE

## 2015-06-05 LAB — OB RESULTS CONSOLE GC/CHLAMYDIA
Chlamydia: NEGATIVE
Gonorrhea: NEGATIVE

## 2015-06-05 LAB — OB RESULTS CONSOLE RPR: RPR: NONREACTIVE

## 2015-06-05 LAB — OB RESULTS CONSOLE HEPATITIS B SURFACE ANTIGEN: Hepatitis B Surface Ag: NEGATIVE

## 2015-06-05 LAB — OB RESULTS CONSOLE ANTIBODY SCREEN: Antibody Screen: NEGATIVE

## 2015-06-05 LAB — OB RESULTS CONSOLE HIV ANTIBODY (ROUTINE TESTING): HIV: NONREACTIVE

## 2015-10-04 ENCOUNTER — Other Ambulatory Visit (HOSPITAL_COMMUNITY): Payer: Self-pay | Admitting: Obstetrics and Gynecology

## 2015-10-04 ENCOUNTER — Encounter (HOSPITAL_COMMUNITY): Payer: Self-pay | Admitting: Obstetrics and Gynecology

## 2015-10-04 DIAGNOSIS — Z3689 Encounter for other specified antenatal screening: Secondary | ICD-10-CM

## 2015-10-04 DIAGNOSIS — Z3A30 30 weeks gestation of pregnancy: Secondary | ICD-10-CM

## 2015-10-12 ENCOUNTER — Ambulatory Visit (HOSPITAL_COMMUNITY)
Admission: RE | Admit: 2015-10-12 | Discharge: 2015-10-12 | Disposition: A | Discharge status: Home / Self Care | Payer: 59 | Source: Ambulatory Visit | Attending: Obstetrics and Gynecology | Admitting: Obstetrics and Gynecology

## 2015-10-12 ENCOUNTER — Encounter (HOSPITAL_COMMUNITY): Payer: Self-pay

## 2015-10-12 DIAGNOSIS — Z3A30 30 weeks gestation of pregnancy: Secondary | ICD-10-CM | POA: Diagnosis not present

## 2015-10-12 DIAGNOSIS — Z3689 Encounter for other specified antenatal screening: Secondary | ICD-10-CM

## 2015-10-12 DIAGNOSIS — Z36 Encounter for antenatal screening of mother: Secondary | ICD-10-CM | POA: Insufficient documentation

## 2015-10-12 NOTE — Progress Notes (Signed)
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 Declines further evaluation at this time #2 Single umbilical artery - has a fetal echo schedule next week. Normal appearing fetal growth on ultrasound today. Serial monthly ultrasounds for fetal growth or reserve for size/date discrepancy on fundal heights. #3 Umbilical vein varix - see ultrasound report for recommendations - increased risk of stillbirth so recommend daily maternal assessment of fetal movements and immediate provider contact for decrease or cessation of fetal movement, twice weekly testing with BPP/UA Doppler alternating with NST - delivery at 37 weeks if still undelivered #4 Hx cesarean  - desires repeat cesarean delivery after counseling by her primary obstetrician  Questions appear answered to her satisfaction. Precautions for the above given. Spent greater than 1/2 of 30 minute visit face to face counseling.

## 2015-10-15 ENCOUNTER — Other Ambulatory Visit (HOSPITAL_COMMUNITY): Payer: Self-pay

## 2015-10-19 ENCOUNTER — Ambulatory Visit (HOSPITAL_COMMUNITY)
Admission: RE | Admit: 2015-10-19 | Discharge: 2015-10-19 | Disposition: A | Discharge status: Home / Self Care | Payer: 59 | Source: Ambulatory Visit | Attending: Obstetrics and Gynecology | Admitting: Obstetrics and Gynecology

## 2015-10-19 ENCOUNTER — Encounter (HOSPITAL_COMMUNITY): Payer: Self-pay

## 2015-10-19 ENCOUNTER — Other Ambulatory Visit (HOSPITAL_COMMUNITY): Payer: Self-pay

## 2015-10-19 ENCOUNTER — Other Ambulatory Visit (HOSPITAL_COMMUNITY): Payer: Self-pay | Admitting: Maternal and Fetal Medicine

## 2015-10-19 DIAGNOSIS — Z3A31 31 weeks gestation of pregnancy: Secondary | ICD-10-CM

## 2015-10-19 DIAGNOSIS — IMO0001 Reserved for inherently not codable concepts without codable children: Secondary | ICD-10-CM

## 2015-10-22 ENCOUNTER — Other Ambulatory Visit (HOSPITAL_COMMUNITY): Payer: Self-pay | Admitting: *Deleted

## 2015-10-22 DIAGNOSIS — O358XX0 Maternal care for other (suspected) fetal abnormality and damage, not applicable or unspecified: Secondary | ICD-10-CM

## 2015-10-23 ENCOUNTER — Other Ambulatory Visit (HOSPITAL_COMMUNITY): Payer: Self-pay | Admitting: Maternal and Fetal Medicine

## 2015-10-23 ENCOUNTER — Ambulatory Visit (HOSPITAL_COMMUNITY)
Admission: RE | Admit: 2015-10-23 | Discharge: 2015-10-23 | Disposition: A | Discharge status: Home / Self Care | Payer: 59 | Source: Ambulatory Visit | Attending: Obstetrics and Gynecology | Admitting: Obstetrics and Gynecology

## 2015-10-23 ENCOUNTER — Encounter (HOSPITAL_COMMUNITY): Payer: Self-pay

## 2015-10-23 DIAGNOSIS — O358XX0 Maternal care for other (suspected) fetal abnormality and damage, not applicable or unspecified: Secondary | ICD-10-CM

## 2015-10-23 DIAGNOSIS — Z3A31 31 weeks gestation of pregnancy: Secondary | ICD-10-CM

## 2015-10-23 DIAGNOSIS — O09523 Supervision of elderly multigravida, third trimester: Secondary | ICD-10-CM

## 2015-10-24 ENCOUNTER — Other Ambulatory Visit (HOSPITAL_COMMUNITY): Payer: Self-pay

## 2015-10-26 ENCOUNTER — Encounter (HOSPITAL_COMMUNITY): Payer: Self-pay

## 2015-10-26 ENCOUNTER — Ambulatory Visit (HOSPITAL_COMMUNITY)
Admission: RE | Admit: 2015-10-26 | Discharge: 2015-10-26 | Disposition: A | Discharge status: Home / Self Care | Payer: 59 | Source: Ambulatory Visit | Attending: Obstetrics and Gynecology | Admitting: Obstetrics and Gynecology

## 2015-10-26 ENCOUNTER — Other Ambulatory Visit (HOSPITAL_COMMUNITY): Payer: Self-pay | Admitting: Maternal and Fetal Medicine

## 2015-10-26 DIAGNOSIS — O358XX0 Maternal care for other (suspected) fetal abnormality and damage, not applicable or unspecified: Secondary | ICD-10-CM

## 2015-10-26 DIAGNOSIS — Z3A31 31 weeks gestation of pregnancy: Secondary | ICD-10-CM

## 2015-10-26 DIAGNOSIS — Z3A32 32 weeks gestation of pregnancy: Secondary | ICD-10-CM | POA: Insufficient documentation

## 2015-10-26 DIAGNOSIS — O09523 Supervision of elderly multigravida, third trimester: Secondary | ICD-10-CM | POA: Diagnosis not present

## 2015-10-30 ENCOUNTER — Other Ambulatory Visit (HOSPITAL_COMMUNITY): Payer: Self-pay | Admitting: Maternal and Fetal Medicine

## 2015-10-30 ENCOUNTER — Encounter (HOSPITAL_COMMUNITY): Payer: Self-pay

## 2015-10-30 ENCOUNTER — Ambulatory Visit (HOSPITAL_COMMUNITY)
Admission: RE | Admit: 2015-10-30 | Discharge: 2015-10-30 | Disposition: A | Discharge status: Home / Self Care | Payer: 59 | Source: Ambulatory Visit | Attending: Obstetrics and Gynecology | Admitting: Obstetrics and Gynecology

## 2015-10-30 DIAGNOSIS — O09523 Supervision of elderly multigravida, third trimester: Secondary | ICD-10-CM | POA: Diagnosis not present

## 2015-10-30 DIAGNOSIS — Z3A32 32 weeks gestation of pregnancy: Secondary | ICD-10-CM | POA: Insufficient documentation

## 2015-10-30 DIAGNOSIS — O358XX0 Maternal care for other (suspected) fetal abnormality and damage, not applicable or unspecified: Secondary | ICD-10-CM

## 2015-11-02 ENCOUNTER — Ambulatory Visit (HOSPITAL_COMMUNITY)
Admission: RE | Admit: 2015-11-02 | Discharge: 2015-11-02 | Disposition: A | Discharge status: Home / Self Care | Payer: 59 | Source: Ambulatory Visit | Attending: Obstetrics and Gynecology | Admitting: Obstetrics and Gynecology

## 2015-11-02 ENCOUNTER — Encounter (HOSPITAL_COMMUNITY): Payer: Self-pay

## 2015-11-02 DIAGNOSIS — O09523 Supervision of elderly multigravida, third trimester: Secondary | ICD-10-CM | POA: Insufficient documentation

## 2015-11-02 DIAGNOSIS — Z3A33 33 weeks gestation of pregnancy: Secondary | ICD-10-CM | POA: Diagnosis not present

## 2015-11-06 ENCOUNTER — Ambulatory Visit (HOSPITAL_COMMUNITY)
Admission: RE | Admit: 2015-11-06 | Discharge: 2015-11-06 | Disposition: A | Discharge status: Home / Self Care | Payer: 59 | Source: Ambulatory Visit | Attending: Obstetrics and Gynecology | Admitting: Obstetrics and Gynecology

## 2015-11-06 ENCOUNTER — Encounter (HOSPITAL_COMMUNITY): Payer: Self-pay

## 2015-11-06 DIAGNOSIS — O09523 Supervision of elderly multigravida, third trimester: Secondary | ICD-10-CM | POA: Diagnosis not present

## 2015-11-06 DIAGNOSIS — Z3A33 33 weeks gestation of pregnancy: Secondary | ICD-10-CM | POA: Insufficient documentation

## 2015-11-06 DIAGNOSIS — O358XX0 Maternal care for other (suspected) fetal abnormality and damage, not applicable or unspecified: Secondary | ICD-10-CM

## 2015-11-09 ENCOUNTER — Ambulatory Visit (HOSPITAL_COMMUNITY)
Admission: RE | Admit: 2015-11-09 | Discharge: 2015-11-09 | Disposition: A | Discharge status: Home / Self Care | Payer: 59 | Source: Ambulatory Visit | Attending: Obstetrics and Gynecology | Admitting: Obstetrics and Gynecology

## 2015-11-09 ENCOUNTER — Encounter (HOSPITAL_COMMUNITY): Payer: Self-pay

## 2015-11-09 ENCOUNTER — Other Ambulatory Visit (HOSPITAL_COMMUNITY): Payer: Self-pay | Admitting: Maternal and Fetal Medicine

## 2015-11-09 VITALS — BP 96/66 | HR 91 | Wt 142.4 lb

## 2015-11-09 DIAGNOSIS — Z3A34 34 weeks gestation of pregnancy: Secondary | ICD-10-CM

## 2015-11-09 DIAGNOSIS — O09523 Supervision of elderly multigravida, third trimester: Secondary | ICD-10-CM | POA: Diagnosis not present

## 2015-11-09 DIAGNOSIS — IMO0001 Reserved for inherently not codable concepts without codable children: Secondary | ICD-10-CM

## 2015-11-09 DIAGNOSIS — O358XX0 Maternal care for other (suspected) fetal abnormality and damage, not applicable or unspecified: Secondary | ICD-10-CM

## 2015-11-09 HISTORY — DX: Other specified health status: Z78.9

## 2015-11-12 ENCOUNTER — Encounter (HOSPITAL_COMMUNITY): Payer: Self-pay

## 2015-11-12 ENCOUNTER — Ambulatory Visit (HOSPITAL_COMMUNITY)
Admission: RE | Admit: 2015-11-12 | Discharge: 2015-11-12 | Disposition: A | Discharge status: Home / Self Care | Payer: 59 | Source: Ambulatory Visit | Attending: Certified Nurse Midwife | Admitting: Certified Nurse Midwife

## 2015-11-12 DIAGNOSIS — Z3A34 34 weeks gestation of pregnancy: Secondary | ICD-10-CM | POA: Diagnosis not present

## 2015-11-12 DIAGNOSIS — O09523 Supervision of elderly multigravida, third trimester: Secondary | ICD-10-CM | POA: Insufficient documentation

## 2015-11-12 DIAGNOSIS — O358XX0 Maternal care for other (suspected) fetal abnormality and damage, not applicable or unspecified: Secondary | ICD-10-CM

## 2015-11-15 ENCOUNTER — Other Ambulatory Visit: Payer: Self-pay | Admitting: Obstetrics

## 2015-11-15 LAB — OB RESULTS CONSOLE GBS: GBS: POSITIVE

## 2015-11-16 ENCOUNTER — Ambulatory Visit (HOSPITAL_COMMUNITY)
Admission: RE | Admit: 2015-11-16 | Discharge: 2015-11-16 | Disposition: A | Discharge status: Home / Self Care | Payer: 59 | Source: Ambulatory Visit | Attending: Obstetrics and Gynecology | Admitting: Obstetrics and Gynecology

## 2015-11-16 ENCOUNTER — Encounter (HOSPITAL_COMMUNITY): Payer: Self-pay

## 2015-11-16 DIAGNOSIS — O09523 Supervision of elderly multigravida, third trimester: Secondary | ICD-10-CM | POA: Insufficient documentation

## 2015-11-16 DIAGNOSIS — Z3A35 35 weeks gestation of pregnancy: Secondary | ICD-10-CM | POA: Diagnosis not present

## 2015-11-20 ENCOUNTER — Encounter (HOSPITAL_COMMUNITY): Payer: Self-pay

## 2015-11-20 ENCOUNTER — Ambulatory Visit (HOSPITAL_COMMUNITY)
Admission: RE | Admit: 2015-11-20 | Discharge: 2015-11-20 | Disposition: A | Discharge status: Home / Self Care | Payer: 59 | Source: Ambulatory Visit | Attending: Certified Nurse Midwife | Admitting: Certified Nurse Midwife

## 2015-11-20 DIAGNOSIS — O09523 Supervision of elderly multigravida, third trimester: Secondary | ICD-10-CM | POA: Diagnosis not present

## 2015-11-20 DIAGNOSIS — Z3A35 35 weeks gestation of pregnancy: Secondary | ICD-10-CM | POA: Diagnosis not present

## 2015-11-20 DIAGNOSIS — O358XX0 Maternal care for other (suspected) fetal abnormality and damage, not applicable or unspecified: Secondary | ICD-10-CM

## 2015-11-21 ENCOUNTER — Encounter (HOSPITAL_COMMUNITY): Payer: Self-pay

## 2015-11-23 ENCOUNTER — Encounter (HOSPITAL_COMMUNITY): Payer: Self-pay

## 2015-11-23 ENCOUNTER — Other Ambulatory Visit (HOSPITAL_COMMUNITY): Payer: Self-pay | Admitting: Maternal and Fetal Medicine

## 2015-11-23 ENCOUNTER — Ambulatory Visit (HOSPITAL_COMMUNITY)
Admission: RE | Admit: 2015-11-23 | Discharge: 2015-11-23 | Disposition: A | Discharge status: Home / Self Care | Payer: 59 | Source: Ambulatory Visit | Attending: Obstetrics and Gynecology | Admitting: Obstetrics and Gynecology

## 2015-11-23 DIAGNOSIS — Z3A36 36 weeks gestation of pregnancy: Secondary | ICD-10-CM | POA: Diagnosis not present

## 2015-11-23 DIAGNOSIS — O09523 Supervision of elderly multigravida, third trimester: Secondary | ICD-10-CM | POA: Diagnosis not present

## 2015-11-23 DIAGNOSIS — IMO0001 Reserved for inherently not codable concepts without codable children: Secondary | ICD-10-CM

## 2015-11-26 ENCOUNTER — Ambulatory Visit (HOSPITAL_COMMUNITY)
Admission: RE | Admit: 2015-11-26 | Discharge: 2015-11-26 | Disposition: A | Discharge status: Home / Self Care | Payer: 59 | Source: Ambulatory Visit | Attending: Obstetrics and Gynecology | Admitting: Obstetrics and Gynecology

## 2015-11-26 DIAGNOSIS — O09523 Supervision of elderly multigravida, third trimester: Secondary | ICD-10-CM | POA: Diagnosis not present

## 2015-11-26 DIAGNOSIS — Z3A36 36 weeks gestation of pregnancy: Secondary | ICD-10-CM | POA: Insufficient documentation

## 2015-11-26 MED ORDER — BETAMETHASONE SOD PHOS & ACET 6 (3-3) MG/ML IJ SUSP
12.0000 mg | Freq: Once | INTRAMUSCULAR | Status: AC
  Administered 2015-11-26: 12 mg via INTRAMUSCULAR
  Filled 2015-11-26: qty 2

## 2015-11-27 ENCOUNTER — Encounter (HOSPITAL_COMMUNITY): Payer: Self-pay

## 2015-11-27 ENCOUNTER — Ambulatory Visit (HOSPITAL_COMMUNITY)
Admission: RE | Admit: 2015-11-27 | Discharge: 2015-11-27 | Disposition: A | Discharge status: Home / Self Care | Payer: 59 | Source: Ambulatory Visit | Attending: Obstetrics and Gynecology | Admitting: Obstetrics and Gynecology

## 2015-11-27 ENCOUNTER — Other Ambulatory Visit (HOSPITAL_COMMUNITY): Payer: Self-pay | Admitting: Maternal and Fetal Medicine

## 2015-11-27 DIAGNOSIS — O358XX0 Maternal care for other (suspected) fetal abnormality and damage, not applicable or unspecified: Secondary | ICD-10-CM

## 2015-11-27 DIAGNOSIS — O09523 Supervision of elderly multigravida, third trimester: Secondary | ICD-10-CM

## 2015-11-27 DIAGNOSIS — Z3A36 36 weeks gestation of pregnancy: Secondary | ICD-10-CM

## 2015-11-27 MED ORDER — BETAMETHASONE SOD PHOS & ACET 6 (3-3) MG/ML IJ SUSP
12.0000 mg | Freq: Once | INTRAMUSCULAR | Status: AC
  Administered 2015-11-27: 12 mg via INTRAMUSCULAR
  Filled 2015-11-27: qty 2

## 2015-11-28 ENCOUNTER — Encounter (HOSPITAL_COMMUNITY)
Admission: RE | Admit: 2015-11-28 | Discharge: 2015-11-28 | Disposition: A | Discharge status: Home / Self Care | Payer: 59 | Source: Ambulatory Visit | Attending: Obstetrics | Admitting: Obstetrics

## 2015-11-28 HISTORY — DX: Personal history of other infectious and parasitic diseases: Z86.19

## 2015-11-28 HISTORY — DX: Anogenital herpesviral infection, unspecified: A60.9

## 2015-11-28 LAB — CBC
HCT: 28.3 % — ABNORMAL LOW (ref 36.0–46.0)
Hemoglobin: 9.2 g/dL — ABNORMAL LOW (ref 12.0–15.0)
MCH: 24.4 pg — ABNORMAL LOW (ref 26.0–34.0)
MCHC: 32.5 g/dL (ref 30.0–36.0)
MCV: 75.1 fL — ABNORMAL LOW (ref 78.0–100.0)
Platelets: 302 K/uL (ref 150–400)
RBC: 3.77 MIL/uL — ABNORMAL LOW (ref 3.87–5.11)
RDW: 16 % — ABNORMAL HIGH (ref 11.5–15.5)
WBC: 13 K/uL — ABNORMAL HIGH (ref 4.0–10.5)

## 2015-11-28 MED ORDER — CEFAZOLIN SODIUM-DEXTROSE 2-4 GM/100ML-% IV SOLN
2.0000 g | INTRAVENOUS | Status: AC
  Administered 2015-11-29: 2 g via INTRAVENOUS

## 2015-11-28 NOTE — Patient Instructions (Signed)
[place]  11/28/2015   Your procedure is scheduled on:  11/29/2015  Enter through the Main Entrance of [facility] up the phone at the desk and dial [phone].   Call this number if you have problems the morning of surgery: [phone]   Remember:   Do not eat food:After Midnight.  Do not drink clear liquids: After Midnight.  Take these medicines the morning of surgery with A SIP OF WATER: none   Do not wear jewelry, make-up or nail polish.  Do not wear lotions, powders, or perfumes. You may wear deodorant.  Do not shave 48 hours prior to surgery.  Do not bring valuables to the hospital.  [facility] is not   responsible for any belongings or valuables brought to the hospital.  Contacts, dentures or bridgework may not be worn into surgery.  Leave suitcase in the car. After surgery it may be brought to your room.  For patients admitted to the hospital, checkout time is 11:00 AM the day of              discharge.   Patients discharged the day of surgery will not be allowed to drive             home.  Name and phone number of your driver: na  Special Instructions:   N/A   Please read over the following fact sheets that you were given:   Surgical Site Infection Prevention

## 2015-11-29 ENCOUNTER — Inpatient Hospital Stay (HOSPITAL_COMMUNITY)
Admission: AD | Admit: 2015-11-29 | Discharge: 2015-12-02 | DRG: 765 | Disposition: A | Discharge status: Home / Self Care | Payer: 59 | Source: Ambulatory Visit | Attending: Obstetrics | Admitting: Obstetrics

## 2015-11-29 ENCOUNTER — Inpatient Hospital Stay (HOSPITAL_COMMUNITY): Payer: 59 | Admitting: Anesthesiology

## 2015-11-29 ENCOUNTER — Encounter (HOSPITAL_COMMUNITY)
Admission: AD | Disposition: A | Discharge status: Home / Self Care | Payer: Self-pay | Source: Ambulatory Visit | Attending: Obstetrics

## 2015-11-29 ENCOUNTER — Encounter (HOSPITAL_COMMUNITY): Payer: Self-pay

## 2015-11-29 DIAGNOSIS — O34211 Maternal care for low transverse scar from previous cesarean delivery: Secondary | ICD-10-CM | POA: Diagnosis present

## 2015-11-29 DIAGNOSIS — A6000 Herpesviral infection of urogenital system, unspecified: Secondary | ICD-10-CM | POA: Diagnosis present

## 2015-11-29 DIAGNOSIS — Z98891 History of uterine scar from previous surgery: Secondary | ICD-10-CM

## 2015-11-29 DIAGNOSIS — O9832 Other infections with a predominantly sexual mode of transmission complicating childbirth: Secondary | ICD-10-CM | POA: Diagnosis present

## 2015-11-29 DIAGNOSIS — Z3A37 37 weeks gestation of pregnancy: Secondary | ICD-10-CM | POA: Diagnosis not present

## 2015-11-29 LAB — RPR: RPR Ser Ql: NONREACTIVE

## 2015-11-29 LAB — PREPARE RBC (CROSSMATCH)

## 2015-11-29 SURGERY — Surgical Case
Anesthesia: Spinal

## 2015-11-29 MED ORDER — PRENATAL MULTIVITAMIN CH
1.0000 | ORAL_TABLET | Freq: Every day | ORAL | Status: DC
  Administered 2015-11-30 – 2015-12-01 (×2): 1 via ORAL
  Filled 2015-11-29 (×2): qty 1

## 2015-11-29 MED ORDER — SODIUM CHLORIDE 0.9 % IR SOLN
Status: DC | PRN
  Administered 2015-11-29: 1000 mL

## 2015-11-29 MED ORDER — DIBUCAINE 1 % RE OINT: 1.0000 "application " | TOPICAL_OINTMENT | RECTAL | Status: DC | PRN

## 2015-11-29 MED ORDER — ACETAMINOPHEN 325 MG PO TABS
650.0000 mg | ORAL_TABLET | ORAL | Status: DC | PRN
Start: 2015-11-29 — End: 2015-12-02
  Administered 2015-11-29 – 2015-11-30 (×2): 650 mg via ORAL
  Filled 2015-11-29 (×3): qty 2

## 2015-11-29 MED ORDER — OXYTOCIN 10 UNIT/ML IJ SOLN
INTRAMUSCULAR | Status: AC
  Filled 2015-11-29: qty 4

## 2015-11-29 MED ORDER — FENTANYL CITRATE (PF) 100 MCG/2ML IJ SOLN
INTRAMUSCULAR | Status: DC | PRN
  Administered 2015-11-29: 12.5 ug via INTRATHECAL

## 2015-11-29 MED ORDER — KETOROLAC TROMETHAMINE 30 MG/ML IJ SOLN
30.0000 mg | Freq: Four times a day (QID) | INTRAMUSCULAR | Status: AC | PRN
  Administered 2015-11-29: 30 mg via INTRAMUSCULAR

## 2015-11-29 MED ORDER — FENTANYL CITRATE (PF) 100 MCG/2ML IJ SOLN
25.0000 ug | INTRAMUSCULAR | Status: DC | PRN
  Administered 2015-11-29: 25 ug via INTRAVENOUS

## 2015-11-29 MED ORDER — MEPERIDINE HCL 25 MG/ML IJ SOLN: 6.2500 mg | INTRAMUSCULAR | Status: DC | PRN

## 2015-11-29 MED ORDER — SCOPOLAMINE 1 MG/3DAYS TD PT72
MEDICATED_PATCH | TRANSDERMAL | Status: AC
  Administered 2015-11-29: 1.5 mg via TRANSDERMAL
  Filled 2015-11-29: qty 1

## 2015-11-29 MED ORDER — MORPHINE SULFATE-NACL 0.5-0.9 MG/ML-% IV SOSY
PREFILLED_SYRINGE | INTRAVENOUS | Status: AC
  Filled 2015-11-29: qty 1

## 2015-11-29 MED ORDER — BUPIVACAINE IN DEXTROSE 0.75-8.25 % IT SOLN
INTRATHECAL | Status: DC | PRN
Start: 2015-11-29 — End: 2015-11-29
  Administered 2015-11-29: 10.5 mg via INTRATHECAL

## 2015-11-29 MED ORDER — NALBUPHINE HCL 10 MG/ML IJ SOLN: 5.0000 mg | INTRAMUSCULAR | Status: DC | PRN

## 2015-11-29 MED ORDER — SIMETHICONE 80 MG PO CHEW
80.0000 mg | CHEWABLE_TABLET | ORAL | Status: DC
  Administered 2015-11-29 – 2015-12-01 (×3): 80 mg via ORAL
  Filled 2015-11-29 (×3): qty 1

## 2015-11-29 MED ORDER — DIPHENHYDRAMINE HCL 50 MG/ML IJ SOLN: 12.5000 mg | INTRAMUSCULAR | Status: DC | PRN

## 2015-11-29 MED ORDER — DIPHENHYDRAMINE HCL 25 MG PO CAPS: 25.0000 mg | ORAL_CAPSULE | ORAL | Status: DC | PRN

## 2015-11-29 MED ORDER — LACTATED RINGERS IV SOLN
Freq: Once | INTRAVENOUS | Status: AC
  Administered 2015-11-29: 12:00:00 via INTRAVENOUS

## 2015-11-29 MED ORDER — MENTHOL 3 MG MT LOZG: 1.0000 | LOZENGE | OROMUCOSAL | Status: DC | PRN

## 2015-11-29 MED ORDER — WITCH HAZEL-GLYCERIN EX PADS: 1.0000 "application " | MEDICATED_PAD | CUTANEOUS | Status: DC | PRN

## 2015-11-29 MED ORDER — SCOPOLAMINE 1 MG/3DAYS TD PT72
1.0000 | MEDICATED_PATCH | Freq: Once | TRANSDERMAL | Status: DC
  Filled 2015-11-29: qty 1

## 2015-11-29 MED ORDER — OXYTOCIN 10 UNIT/ML IJ SOLN
40.0000 [IU] | INTRAVENOUS | Status: DC | PRN
  Administered 2015-11-29: 40 [IU] via INTRAVENOUS

## 2015-11-29 MED ORDER — NALBUPHINE HCL 10 MG/ML IJ SOLN: 5.0000 mg | Freq: Once | INTRAMUSCULAR | Status: DC | PRN

## 2015-11-29 MED ORDER — PHENYLEPHRINE 8 MG IN D5W 100 ML (0.08MG/ML) PREMIX OPTIME
INJECTION | INTRAVENOUS | Status: AC
  Filled 2015-11-29: qty 100

## 2015-11-29 MED ORDER — MORPHINE SULFATE-NACL 0.5-0.9 MG/ML-% IV SOSY
PREFILLED_SYRINGE | INTRAVENOUS | Status: DC | PRN
  Administered 2015-11-29: .2 mg via INTRATHECAL

## 2015-11-29 MED ORDER — PHENYLEPHRINE 8 MG IN D5W 100 ML (0.08MG/ML) PREMIX OPTIME
INJECTION | INTRAVENOUS | Status: DC | PRN
Start: 2015-11-29 — End: 2015-11-29
  Administered 2015-11-29: 60 ug/min via INTRAVENOUS

## 2015-11-29 MED ORDER — DIPHENHYDRAMINE HCL 25 MG PO CAPS
25.0000 mg | ORAL_CAPSULE | Freq: Four times a day (QID) | ORAL | Status: DC | PRN
Start: 2015-11-29 — End: 2015-12-02

## 2015-11-29 MED ORDER — SODIUM CHLORIDE 0.9% FLUSH: 3.0000 mL | INTRAVENOUS | Status: DC | PRN

## 2015-11-29 MED ORDER — FENTANYL CITRATE (PF) 100 MCG/2ML IJ SOLN
INTRAMUSCULAR | Status: AC
  Filled 2015-11-29: qty 2

## 2015-11-29 MED ORDER — SIMETHICONE 80 MG PO CHEW
80.0000 mg | CHEWABLE_TABLET | Freq: Three times a day (TID) | ORAL | Status: DC
  Administered 2015-11-29 – 2015-12-02 (×8): 80 mg via ORAL
  Filled 2015-11-29 (×8): qty 1

## 2015-11-29 MED ORDER — LACTATED RINGERS IV SOLN
INTRAVENOUS | Status: DC
  Administered 2015-11-29 – 2015-11-30 (×2): via INTRAVENOUS

## 2015-11-29 MED ORDER — TETANUS-DIPHTH-ACELL PERTUSSIS 5-2.5-18.5 LF-MCG/0.5 IM SUSP: 0.5000 mL | Freq: Once | INTRAMUSCULAR | Status: DC

## 2015-11-29 MED ORDER — OXYTOCIN 40 UNITS IN LACTATED RINGERS INFUSION - SIMPLE MED: 2.5000 [IU]/h | INTRAVENOUS | Status: AC

## 2015-11-29 MED ORDER — LACTATED RINGERS IV SOLN: INTRAVENOUS | Status: DC

## 2015-11-29 MED ORDER — LACTATED RINGERS IV SOLN
INTRAVENOUS | Status: DC
Start: 2015-11-29 — End: 2015-11-29
  Administered 2015-11-29 (×2): via INTRAVENOUS

## 2015-11-29 MED ORDER — SIMETHICONE 80 MG PO CHEW: 80.0000 mg | CHEWABLE_TABLET | ORAL | Status: DC | PRN

## 2015-11-29 MED ORDER — NALOXONE HCL 0.4 MG/ML IJ SOLN: 0.4000 mg | INTRAMUSCULAR | Status: DC | PRN

## 2015-11-29 MED ORDER — ONDANSETRON HCL 4 MG/2ML IJ SOLN
INTRAMUSCULAR | Status: AC
  Filled 2015-11-29: qty 2

## 2015-11-29 MED ORDER — KETOROLAC TROMETHAMINE 30 MG/ML IJ SOLN: 30.0000 mg | Freq: Four times a day (QID) | INTRAMUSCULAR | Status: AC | PRN

## 2015-11-29 MED ORDER — SENNOSIDES-DOCUSATE SODIUM 8.6-50 MG PO TABS
2.0000 | ORAL_TABLET | ORAL | Status: DC
  Administered 2015-11-29 – 2015-12-01 (×3): 2 via ORAL
  Filled 2015-11-29 (×3): qty 2

## 2015-11-29 MED ORDER — KETOROLAC TROMETHAMINE 30 MG/ML IJ SOLN
INTRAMUSCULAR | Status: AC
  Filled 2015-11-29: qty 1

## 2015-11-29 MED ORDER — OXYCODONE HCL 5 MG PO TABS
10.0000 mg | ORAL_TABLET | ORAL | Status: DC | PRN
  Administered 2015-11-30 – 2015-12-01 (×2): 10 mg via ORAL
  Filled 2015-11-29 (×2): qty 2

## 2015-11-29 MED ORDER — OXYCODONE HCL 5 MG PO TABS
5.0000 mg | ORAL_TABLET | ORAL | Status: DC | PRN
  Administered 2015-11-30 – 2015-12-02 (×6): 5 mg via ORAL
  Filled 2015-11-29 (×6): qty 1

## 2015-11-29 MED ORDER — ONDANSETRON HCL 4 MG/2ML IJ SOLN: 4.0000 mg | Freq: Three times a day (TID) | INTRAMUSCULAR | Status: DC | PRN

## 2015-11-29 MED ORDER — NALOXONE HCL 2 MG/2ML IJ SOSY
1.0000 ug/kg/h | PREFILLED_SYRINGE | INTRAVENOUS | Status: DC | PRN
  Filled 2015-11-29: qty 2

## 2015-11-29 MED ORDER — COCONUT OIL OIL
1.0000 | TOPICAL_OIL | Status: DC | PRN
Start: 2015-11-29 — End: 2015-12-02

## 2015-11-29 MED ORDER — IBUPROFEN 600 MG PO TABS
600.0000 mg | ORAL_TABLET | Freq: Four times a day (QID) | ORAL | Status: DC
  Administered 2015-11-29 – 2015-12-02 (×9): 600 mg via ORAL
  Filled 2015-11-29 (×9): qty 1

## 2015-11-29 MED ORDER — ONDANSETRON HCL 4 MG/2ML IJ SOLN
INTRAMUSCULAR | Status: DC | PRN
Start: 2015-11-29 — End: 2015-11-29
  Administered 2015-11-29: 4 mg via INTRAVENOUS

## 2015-11-29 MED ORDER — SCOPOLAMINE 1 MG/3DAYS TD PT72
1.0000 | MEDICATED_PATCH | Freq: Once | TRANSDERMAL | Status: DC
  Administered 2015-11-29: 1.5 mg via TRANSDERMAL

## 2015-11-29 SURGICAL SUPPLY — 34 items
BENZOIN TINCTURE PRP APPL 2/3 (GAUZE/BANDAGES/DRESSINGS) ×2 IMPLANT
CHLORAPREP W/TINT 26ML (MISCELLANEOUS) ×2 IMPLANT
CLAMP CORD UMBIL (MISCELLANEOUS) IMPLANT
CLOTH BEACON ORANGE TIMEOUT ST (SAFETY) ×2 IMPLANT
DRSG OPSITE POSTOP 4X10 (GAUZE/BANDAGES/DRESSINGS) ×2 IMPLANT
ELECT REM PT RETURN 9FT ADLT (ELECTROSURGICAL) ×2
ELECTRODE REM PT RTRN 9FT ADLT (ELECTROSURGICAL) ×1 IMPLANT
EXTRACTOR VACUUM KIWI (MISCELLANEOUS) IMPLANT
GLOVE BIO SURGEON STRL SZ 6 (GLOVE) ×2 IMPLANT
GLOVE BIOGEL PI IND STRL 6.5 (GLOVE) ×1 IMPLANT
GLOVE BIOGEL PI IND STRL 7.0 (GLOVE) ×1 IMPLANT
GLOVE BIOGEL PI INDICATOR 6.5 (GLOVE) ×1
GLOVE BIOGEL PI INDICATOR 7.0 (GLOVE) ×1
GOWN STRL REUS W/TWL LRG LVL3 (GOWN DISPOSABLE) ×4 IMPLANT
KIT ABG SYR 3ML LUER SLIP (SYRINGE) IMPLANT
NEEDLE HYPO 25X5/8 SAFETYGLIDE (NEEDLE) IMPLANT
NS IRRIG 1000ML POUR BTL (IV SOLUTION) ×2 IMPLANT
PACK C SECTION WH (CUSTOM PROCEDURE TRAY) ×2 IMPLANT
PAD OB MATERNITY 4.3X12.25 (PERSONAL CARE ITEMS) ×2 IMPLANT
PENCIL SMOKE EVAC W/HOLSTER (ELECTROSURGICAL) ×2 IMPLANT
RTRCTR C-SECT PINK 25CM LRG (MISCELLANEOUS) ×2 IMPLANT
STRIP CLOSURE SKIN 1/2X4 (GAUZE/BANDAGES/DRESSINGS) ×2 IMPLANT
SUT MNCRL 0 VIOLET CTX 36 (SUTURE) ×2 IMPLANT
SUT MNCRL AB 3-0 PS2 27 (SUTURE) ×2 IMPLANT
SUT MONOCRYL 0 CTX 36 (SUTURE) ×2
SUT PLAIN 0 NONE (SUTURE) IMPLANT
SUT PLAIN 2 0 (SUTURE) ×1
SUT PLAIN ABS 2-0 CT1 27XMFL (SUTURE) ×1 IMPLANT
SUT VIC AB 0 CTX 36 (SUTURE) ×2
SUT VIC AB 0 CTX36XBRD ANBCTRL (SUTURE) ×2 IMPLANT
SUT VIC AB 2-0 CT1 27 (SUTURE) ×1
SUT VIC AB 2-0 CT1 TAPERPNT 27 (SUTURE) ×1 IMPLANT
TOWEL OR 17X24 6PK STRL BLUE (TOWEL DISPOSABLE) ×2 IMPLANT
TRAY FOLEY CATH SILVER 14FR (SET/KITS/TRAYS/PACK) ×2 IMPLANT

## 2015-11-29 NOTE — Transfer of Care (Signed)
Immediate Anesthesia Transfer of Care Note  Patient: Ashley Mendez  Procedure(s) Performed: Procedure(s): REPEAT CESAREAN SECTION, TWO DESSEL CORDS (N/A)  Patient Location: PACU  Anesthesia Type:Spinal  Level of Consciousness: awake  Airway & Oxygen Therapy: Patient Spontanous Breathing  Post-op Assessment: Report given to RN  Post vital signs: Reviewed and stable  Last Vitals:  Filed Vitals:   11/29/15 1102  BP: 101/61  Pulse: 93  Temp: 36.7 C  Resp: 16    Last Pain: There were no vitals filed for this visit.    Patients Stated Pain Goal: 3 ([id number])  Complications: No apparent anesthesia complications

## 2015-11-29 NOTE — Anesthesia Preprocedure Evaluation (Signed)
Anesthesia Evaluation  Patient identified by MRN, date of birth, ID band Patient awake    Reviewed: Allergy & Precautions, NPO status , Patient's Chart, lab work & pertinent test results  Airway Mallampati: II  TM Distance: >3 FB Neck ROM: Full    Dental no notable dental hx.    Pulmonary neg pulmonary ROS,    Pulmonary exam normal breath sounds clear to auscultation       Cardiovascular negative cardio ROS Normal cardiovascular exam Rhythm:Regular Rate:Normal     Neuro/Psych negative neurological ROS  negative psych ROS   GI/Hepatic negative GI ROS, Neg liver ROS,   Endo/Other  negative endocrine ROS  Renal/GU negative Renal ROS  negative genitourinary   Musculoskeletal negative musculoskeletal ROS (+)   Abdominal   Peds negative pediatric ROS (+)  Hematology negative hematology ROS (+)   Anesthesia Other Findings   Reproductive/Obstetrics (+) Pregnancy                             Anesthesia Physical Anesthesia Plan  ASA: II  Anesthesia Plan: Spinal   Post-op Pain Management:    Induction:   Airway Management Planned: Natural Airway  Additional Equipment:   Intra-op Plan:   Post-operative Plan:   Informed Consent: I have reviewed the patients History and Physical, chart, labs and discussed the procedure including the risks, benefits and alternatives for the proposed anesthesia with the patient or authorized representative who has indicated his/her understanding and acceptance.   Dental advisory given  Plan Discussed with: CRNA  Anesthesia Plan Comments:         Anesthesia Quick Evaluation

## 2015-11-29 NOTE — Lactation Note (Signed)
This note was copied from a baby's chart. Lactation Consultation Note Initial visit at 4 hours of age.  [place] asked mom if she has decided on how she plans to feed her baby and mom reports she will probably breast and formula feeding because she "doesn't like the idea of breastfeeding."  Mom has a 36 year old that she breastfed for about 6 weeks and gave formula, but no pumping.  [place] questioned mom about using pump for bottle feeding and mom strongly declined.  [place] arrived with MD was checking baby as [place] arrived, and then placed baby with mom STS who calmed quickly.   Mom reports baby only sucked a few times when attempting latch earlier.  LC offered to assist with latching at this time and mom declined.  LC advised mom to call Rn for assist with next feeding.  Mom asked for a NS, when asked why, Mom reports, "because I prefer it."  Baby is 61 hours old and LC is unable to assess for need at this visit.   LC advised mom of risk with NS use and encouraged mom to attempt latch without and call for assist as needed, mom agreeable.  [facility] LC resources given and discussed.  Encouraged to feed with early cues on demand.  Early newborn behavior discussed.  Hand expression encouraged.   Mom to call for assist as needed.     Patient Name: Ashley Mendez [id number] Date: 11/29/2015 Reason for consult: Initial assessment   Maternal Data Has patient been taught Hand Expression?: No Does the patient have breastfeeding experience prior to this delivery?: Yes  Feeding Feeding Type: Breast Fed  LATCH Score/Interventions                Intervention(s): Breastfeeding basics reviewed;Skin to skin     Lactation Tools Discussed/Used     Consult Status Consult Status: PRN    Justice Britain 11/29/2015, 5:57 PM

## 2015-11-29 NOTE — Brief Op Note (Signed)
11/29/2015  1:35 PM  PATIENT:  Ashley Mendez  35 y.o. female  PRE-OPERATIVE DIAGNOSIS:  prior cesarean section desires repeat    POST-OPERATIVE DIAGNOSIS:  prior cesarean section desires repeat  PROCEDURE:  Procedure(s): REPEAT CESAREAN SECTION, TWO DESSEL CORDS (N/A)  SURGEON:  Surgeon(s) and Role:    * Jerelyn Charles, MD - Primary  ASSISTANTS: Alden Hipp, MD   ANESTHESIA:   spinal  EBL:  Total I/O In: 1500 [I.V.:1500] Out: 800 [Urine:200; Blood:600]  BLOOD ADMINISTERED:none  DRAINS: none   LOCAL MEDICATIONS USED:  NONE  SPECIMEN:  No Specimen  DISPOSITION OF SPECIMEN:  N/A  COUNTS:  YES  TOURNIQUET:  * No tourniquets in log *  DICTATION: .Note written in EPIC  PLAN OF CARE: Admit to inpatient   PATIENT DISPOSITION:  PACU - hemodynamically stable.   Delay start of Pharmacological VTE agent (>24hrs) due to surgical blood loss or risk of bleeding: not applicable

## 2015-11-29 NOTE — Lactation Note (Signed)
This note was copied from a baby's chart. Lactation Consultation Note Rn called and requested Lc at bedside.  Rn has been working with trying to latch baby and baby doesn't stay latched.  Baby is sleepy.  Lc assisted with pillow support and asked mom for permission to help with latch and touch her breast.  Mom gave permission.  Mom has easily expressed colostrum dripping from breasts.  Baby showing feeding cues and sleepy.  Baby finally latched for about 3 minutes with gulping audible.  Baby unlatched himself and laid STS with mom and then attempted to self latch.  LC again assisted with latching.  LC reviewed basics with mom.  Baby remains STS with mom.    Patient Name: Ashley Mendez [id number] Date: 11/29/2015 Reason for consult: Follow-up assessment   Maternal Data Has patient been taught Hand Expression?: No Does the patient have breastfeeding experience prior to this delivery?: Yes  Feeding Feeding Type: Breast Fed Length of feed:  (few minutes)  LATCH Score/Interventions Latch: Repeated attempts needed to sustain latch, nipple held in mouth throughout feeding, stimulation needed to elicit sucking reflex. Intervention(s): Adjust position;Assist with latch;Breast massage;Breast compression  Audible Swallowing: A few with stimulation  Type of Nipple: Everted at rest and after stimulation  Comfort (Breast/Nipple): Soft / non-tender     Hold (Positioning): Assistance needed to correctly position infant at breast and maintain latch. Intervention(s): Breastfeeding basics reviewed;Support Pillows;Position options;Skin to skin  LATCH Score: 7  Lactation Tools Discussed/Used     Consult Status Consult Status: Follow-up Date: 11/30/15 Follow-up type: In-patient    Justice Britain 11/29/2015, 8:23 PM

## 2015-11-29 NOTE — Anesthesia Procedure Notes (Signed)
Spinal Patient location during procedure: OR Staffing Anesthesiologist: Naraya Stoneberg Performed by: anesthesiologist  Preanesthetic Checklist Completed: patient identified, site marked, surgical consent, pre-op evaluation, timeout performed, IV checked, risks and benefits discussed and monitors and equipment checked Spinal Block Patient position: sitting Prep: ChloraPrep Patient monitoring: heart rate, continuous pulse ox and blood pressure Approach: right paramedian Location: L4-5 Injection technique: single-shot Needle Needle type: Sprotte  Needle gauge: 24 G Needle length: 9 cm Additional Notes Expiration date of kit checked and confirmed. Patient tolerated procedure well, without complications.     

## 2015-11-29 NOTE — H&P (Signed)
35 y.o. G2P1001 @ [redacted]w[redacted]d presents for repeat cesarean section.  She had a c/s w G1 for arrest of descent and desires repeat.    Pregnancy is complicated by: 1. 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  Recommendation is delivery at [place].  Most recent growth [place] on 6/30: EFW 5lb 3 oz (55%) 2. AMA: low risk NIPT    Past Medical History  Diagnosis Date  . Medical history non-contributory   . HSV (herpes simplex virus) anogenital infection   . Hx of varicella     Past Surgical History  Procedure Laterality Date  . Cesarean section N/A 12/06/2014    Procedure: CESAREAN SECTION;  Surgeon: Jerelyn Charles, MD;  Location: [facility];  Service: Obstetrics;  Laterality: N/A;    OB History  Gravida Para Term Preterm AB SAB TAB Ectopic Multiple Living  2 1 1       0 1    # Outcome Date GA Lbr Len/2nd Weight Sex Delivery Anes PTL Lv  2 Current           1 Term 12/06/14 [redacted]w[redacted]d  3.141 kg (6 lb 14.8 oz) F CS-LTranv EPI  Y      Social History   Social History  . Marital Status: Married    Spouse Name: N/A  . Number of Children: N/A  . Years of Education: N/A   Occupational History  . Not on file.   Social History Main Topics  . Smoking status: Never Smoker   . Smokeless tobacco: Never Used  . Alcohol Use: Yes  . Drug Use: No  . Sexual Activity:    Partners: Male   Other Topics Concern  . Not on file   Social History Narrative   Review of patient's allergies indicates no known allergies.    Prenatal Transfer Tool  Maternal Diabetes: No Genetic Screening: Normal Maternal Ultrasounds/Referrals: Abnormal:  Findings:   Other: 2VC and umbilical vein varix Fetal Ultrasounds or other Referrals:  Referred to Materal Fetal Medicine  Maternal Substance Abuse:  No Significant Maternal Medications:  None Significant Maternal Lab Results: None  ABO, Rh: --/--/O POS (07/19 1105) Antibody: NEG (07/19 1105) Rubella:  Immune RPR: Non Reactive (07/19 1105)  HBsAg: Negative (01/24 0000)  HIV: Non-reactive (01/24 0000)  GBS: Positive (07/06 0000)       Filed Vitals:   11/29/15 1102  BP: 101/61  Pulse: 93  Temp: 98 F (36.7 C)  Resp: 16     General:  NAD Abdomen:  soft, gravid Ex:  no edema FHTs:  165    A/P   35 y.o. G2P1001 [redacted]w[redacted]d presents for repeat cesarean section.  Discussed risks of cesarean section to include, but not limited to, infection, bleeding, damage to surrounding strutcures (including bowel, bladder, tubes, ovaries, nerves, vessels, baby), need for additional procedures, risk of blood clot, need for transfusion. Consent signed Ancef 2gm on call to [place]

## 2015-11-29 NOTE — Op Note (Signed)
Cesarean Section Procedure Note  Pre-operative Diagnosis: 1. Intrauterine pregnancy at [redacted]w[redacted]d  2. Prior cesarean delivery, desires repeat  3.  Umbilical vein varix  Post-operative Diagnosis: same as above  Surgeon: Jerelyn Charles, MD  Assistants: Alden Hipp, MD  Procedure: Repeat low transverse cesarean section   Anesthesia: Spinal anesthesia  Estimated Blood Loss: 600 mL         Drains: Foley catheter         Specimens: placenta to L&D         Implants: none         Complications:  None; patient tolerated the procedure well.         Disposition: PACU - hemodynamically stable.  Findings:  Normal uterus, tubes and ovaries bilaterally.  Viable female infant, weight pending, Apgars 9, 9.    Procedure Details   After spinal anesthesia was found to adequate , the patient was placed in the dorsal supine position with a leftward tilt, draped and prepped in the usual sterile manner. A Pfannenstiel incision was made and carried down through the subcutaneous tissue to the fascia. The fascia was incised in the midline and the fascial incision was extended laterally with Mayo scissors. The superior aspect of the fascial incision was grasped with two Kocher clamp, tented up and the rectus muscles dissected off sharply. The rectus was then dissected off with blunt dissection and Mayo scissors inferiorly. The rectus muscles were separated in the midline. The abdominal peritoneum was identified, tented up, entered sharply, and the incision was extended superiorly and inferiorly with good visualization of the bladder. The Alexis retractor was deployed. The vesicouterine peritoneum was identified and the bladder flap was created digitally. Scalpel was then used to make a low transverse incision on the uterus which was extended in the cephalad-caudad direction with blunt dissection. The fluid was clear. The fetal vertex was identified, elevated out of the pelvis and brought to the hysterotomy. The head  was delivered easily followed by the shoulders and body. A nuchal cord times one was not reduced prior to delivery. The cord was clamped and cut and the infant was passed to the waiting neonatologist. Placenta was then delivered spontaneously, intact and appear normal, the uterus was cleared of all clot and debris   The hysterotomy was repaired with #0 Monocryl in running locked fashion.   The serosal edges of the incision were oozy, and bovie cautery was used to achieve hemostasis.  The hysterotomy was reexamined and excellent hemostasis was noted.  The Alexis retractor was removed from the abdomen. The peritoneum was examined and all vessels noted to be hemostatic. The abdominal cavity was cleared of all clot and debris.  The peritoneum was closed with 2-0 vicryl in a running fashion. The fascia and rectus muscles were inspected and were hemostatic. The fascia was closed with 0 Vicryl in a running fashion. The subcuticular layer was irrigated and all bleeders cauterized.  The subcutaneous layer was re approximated with interrupted 3-0 plain gut.  The skin was closed with 3-0 monocryl in a subcuticular fashion. The incision was dressed with benzoine, steri strips and pressure dressing. All sponge lap and needle counts were correct x3. Patient tolerated the procedure well and recovered in stable condition following the procedure.

## 2015-11-30 ENCOUNTER — Encounter (HOSPITAL_COMMUNITY): Payer: Self-pay | Admitting: Obstetrics

## 2015-11-30 LAB — CBC
HCT: 26.9 % — ABNORMAL LOW (ref 36.0–46.0)
Hemoglobin: 8.8 g/dL — ABNORMAL LOW (ref 12.0–15.0)
MCH: 24.5 pg — ABNORMAL LOW (ref 26.0–34.0)
MCHC: 32.7 g/dL (ref 30.0–36.0)
MCV: 74.9 fL — ABNORMAL LOW (ref 78.0–100.0)
Platelets: 258 10*3/uL (ref 150–400)
RBC: 3.59 MIL/uL — ABNORMAL LOW (ref 3.87–5.11)
RDW: 16.3 % — ABNORMAL HIGH (ref 11.5–15.5)
WBC: 14.5 10*3/uL — ABNORMAL HIGH (ref 4.0–10.5)

## 2015-11-30 LAB — BIRTH TISSUE RECOVERY COLLECTION (PLACENTA DONATION)

## 2015-11-30 NOTE — Progress Notes (Signed)
Subjective: Postpartum Day 1: Cesarean Delivery Patient reports pain controlled, no nausea or vomiting, ambulating without difficulty  Objective: Vital signs in last 24 hours: Temp:  [97.7 F (36.5 C)-98.5 F (36.9 C)] 98.5 F (36.9 C) (07/21 0855) Pulse Rate:  [58-93] 58 (07/21 0855) Resp:  [13-19] 18 (07/21 0855) BP: (92-118)/(50-78) 103/63 mmHg (07/21 0855) SpO2:  [95 %-100 %] 98 % (07/21 0855)  Physical Exam:  General: alert, cooperative and appears stated age Lochia: appropriate Uterine Fundus: firm Incision: healing well DVT Evaluation: No evidence of DVT seen on physical exam.   Recent Labs  11/28/15 1105 11/30/15 0559  HGB 9.2* 8.8*  HCT 28.3* 26.9*    Assessment/Plan: Status post Cesarean section. Doing well postoperatively.  Continue current care. Desires neonatal circumcision, Will have done at peds office  Kier Smead H. 11/30/2015, 9:21 AM

## 2015-11-30 NOTE — Lactation Note (Signed)
This note was copied from a baby's chart. Lactation Consultation Note  Patient Name: Ashley Mendez [place] [id number] Date: 11/30/2015 Reason for consult: Follow-up assessment  with this mom of a 25 hour old baby   , born at 38 1/[redacted] weeks gestation. Mom is brest feeding if she can get the baby to latch, otherwise is formula/bottle feeding. She does not want to pump, and I understood her to be saying she did not want or need lactation help/ she knows to call if she has questions/concerns.     Maternal Data    Feeding Feeding Type: Breast Fed Length of feed: 15 min  LATCH Score/Interventions Latch: Repeated attempts needed to sustain latch, nipple held in mouth throughout feeding, stimulation needed to elicit sucking reflex. Intervention(s): Adjust position;Assist with latch  Audible Swallowing: A few with stimulation  Type of Nipple: Everted at rest and after stimulation  Comfort (Breast/Nipple): Soft / non-tender     Hold (Positioning): Assistance needed to correctly position infant at breast and maintain latch. Intervention(s): Breastfeeding basics reviewed;Support Pillows;Position options;Skin to skin  LATCH Score: 7  Lactation Tools Discussed/Used     Consult Status Consult Status: PRN Follow-up type: Call as needed    Tonna Corner 11/30/2015, 5:15 PM

## 2015-11-30 NOTE — Anesthesia Postprocedure Evaluation (Signed)
Anesthesia Post Note  Patient: [name], advertising  Procedure(s) Performed: Procedure(s) (LRB): REPEAT CESAREAN SECTION, TWO DESSEL CORDS (N/A)  Patient location during evaluation: Mother Baby Anesthesia Type: Spinal Level of consciousness: awake, awake and alert, oriented and patient cooperative Pain management: pain level controlled Vital Signs Assessment: post-procedure vital signs reviewed and stable Respiratory status: spontaneous breathing, nonlabored ventilation and respiratory function stable Cardiovascular status: stable Postop Assessment: no headache, no backache, patient able to bend at knees and no signs of nausea or vomiting Anesthetic complications: no     Last Vitals:  Filed Vitals:   11/29/15 2001 11/30/15 0539  BP: 96/50 99/58  Pulse: 62 60  Temp: 36.8 C 36.6 C  Resp: 18     Last Pain:  Filed Vitals:   11/30/15 0540  PainSc: 2    Pain Goal: Patients Stated Pain Goal: 2 (11/29/15 1447)               [name] L

## 2015-12-01 NOTE — Progress Notes (Signed)
Subjective: Postpartum Day 2: Cesarean Delivery Patient reports pain controlled, no nausea or vomiting. Ambulating and voiding without difficulty  Objective: Vital signs in last 24 hours: Temp:  [97.9 F (36.6 C)-98.2 F (36.8 C)] 97.9 F (36.6 C) (07/22 0610) Pulse Rate:  [59-78] 59 (07/22 0610) Resp:  [18] 18 (07/22 0610) BP: (99-121)/(67-74) 102/67 mmHg (07/22 0610) SpO2:  [99 %] 99 % (07/21 1400)  Physical Exam:  General: alert, cooperative and appears stated age Lochia: appropriate Uterine Fundus: firm Incision: healing well DVT Evaluation: No evidence of DVT seen on physical exam.   Recent Labs  11/30/15 0559  HGB 8.8*  HCT 26.9*    Assessment/Plan: Status post Cesarean section. Doing well postoperatively.  Continue current care.  Teegan Brandis H. 12/01/2015, 12:12 PM

## 2015-12-01 NOTE — Progress Notes (Signed)
Mother is breastfeeding and supplementing with formula. She reports she "likes to feed formula". Her plan is to feed both formula and breastfeed in the hospital and after discharge. She reports she has a one year old that she breast fed for approximately 6 weeks, feeding at breast 3 times per day and the remaining feedings were formula. She said her milk "dried up" because she didn't breastfeed often to maintain her supply. Patient reports being aware of the risk of decreased milk supply. Patient supported with her feeding decision. Infant is 37.1 weeks. She is not pumping.

## 2015-12-02 LAB — TYPE AND SCREEN
ABO/RH(D): O POS
Antibody Screen: NEGATIVE
Unit division: 0
Unit division: 0

## 2015-12-02 MED ORDER — OXYCODONE-ACETAMINOPHEN 5-325 MG PO TABS: 1.0000 | ORAL_TABLET | ORAL | 0 refills | Status: DC | PRN

## 2015-12-02 MED ORDER — DOCUSATE SODIUM 100 MG PO CAPS: 100.0000 mg | ORAL_CAPSULE | Freq: Two times a day (BID) | ORAL | 0 refills | Status: DC

## 2015-12-02 MED ORDER — IBUPROFEN 600 MG PO TABS: 600.0000 mg | ORAL_TABLET | Freq: Four times a day (QID) | ORAL | 0 refills | Status: DC | PRN

## 2015-12-02 NOTE — Discharge Summary (Signed)
Obstetric Discharge Summary Reason for Admission: cesarean section Prenatal Procedures: NST and ultrasound Intrapartum Procedures: cesarean: low cervical, transverse Postpartum Procedures: none Complications-Operative and Postpartum: none Hemoglobin  Date Value Ref Range Status  11/30/2015 8.8 (L) 12.0 - 15.0 g/dL Final   HCT  Date Value Ref Range Status  11/30/2015 26.9 (L) 36.0 - 46.0 % Final    Physical Exam:  General: alert, cooperative and appears stated age 35: appropriate Uterine Fundus: firm Incision: healing well DVT Evaluation: No evidence of DVT seen on physical exam.  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   Specialty:  Obstetrics Why:  For a post-partum evaluation Contact information: [address] [place] [address] [phone]           Newborn Data: Live born female  Birth Weight: 6 lb 10.6 oz (3022 g) APGAR: 9, 9  Home with mother.  Sarath Privott H. 12/02/2015, 9:29 AM

## 2016-10-06 ENCOUNTER — Ambulatory Visit (HOSPITAL_COMMUNITY)
Admission: EM | Admit: 2016-10-06 | Discharge: 2016-10-06 | Disposition: A | Discharge status: Home / Self Care | Payer: 59 | Attending: Family Medicine | Admitting: Family Medicine

## 2016-10-06 ENCOUNTER — Encounter (HOSPITAL_COMMUNITY): Payer: Self-pay | Admitting: Family Medicine

## 2016-10-06 DIAGNOSIS — J0101 Acute recurrent maxillary sinusitis: Secondary | ICD-10-CM | POA: Diagnosis not present

## 2016-10-06 MED ORDER — AMOXICILLIN-POT CLAVULANATE 875-125 MG PO TABS: 1.0000 | ORAL_TABLET | Freq: Two times a day (BID) | ORAL | 0 refills | Status: DC

## 2016-10-06 NOTE — ED Provider Notes (Signed)
[facility]    CSN: [id number] Arrival date & time: 10/06/16  1329     History   Chief Complaint No chief complaint on file.   HPI Ashley Mendez is a 36 y.o. female.   This is a 36 year old woman who presents at the [facility] urgent care center for evaluation of sinus congestion. She's been sick for about 10 days and has developed right facial pain.  Patient works for [employer] as a Engineer, water      Past Medical History:  Diagnosis Date  . HSV (herpes simplex virus) anogenital infection   . Hx of varicella   . Medical history non-contributory     Patient Active Problem List   Diagnosis Date Noted  . Status post repeat low transverse cesarean section 11/29/2015  . Indication for care in labor or delivery 12/05/2014  . Chronic UTI 01/22/2013    Past Surgical History:  Procedure Laterality Date  . CESAREAN SECTION N/A 12/06/2014   Procedure: CESAREAN SECTION;  Surgeon: Jerelyn Charles, MD;  Location: [facility];  Service: Obstetrics;  Laterality: N/A;  . CESAREAN SECTION N/A 11/29/2015   Procedure: REPEAT CESAREAN SECTION, TWO DESSEL CORDS;  Surgeon: Jerelyn Charles, MD;  Location: [facility];  Service: Obstetrics;  Laterality: N/A;    OB History    Gravida Para Term Preterm AB Living   2 2 2     2    SAB TAB Ectopic Multiple Live Births         0 2       Home Medications    Prior to Admission medications   Medication Sig Start Date End Date Taking? Authorizing Provider  amoxicillin-clavulanate (AUGMENTIN) 875-125 MG tablet Take 1 tablet by mouth every 12 (twelve) hours. 10/06/16   Robyn Haber, MD  docusate sodium (COLACE) 100 MG capsule Take 1 capsule (100 mg total) by mouth 2 (two) times daily. 12/02/15   Vanessa Kick, MD  hydrocortisone 2.5 % cream Apply 1 application topically 2 (two) times daily as needed (itching).     [provider]  ibuprofen (ADVIL,MOTRIN) 600 MG tablet Take 1 tablet (600 mg total) by  mouth every 6 (six) hours as needed. 12/02/15   Vanessa Kick, MD  LIDOCAINE, ANORECTAL, EX Apply topically.    [provider]  oxyCODONE-acetaminophen (ROXICET) 5-325 MG tablet Take 1-2 tablets by mouth every 4 (four) hours as needed for severe pain. 12/02/15   Vanessa Kick, MD  Prenatal Vit-Min-FA-Fish Oil ([facility] PRENATAL GUMMY PO) Take 2 tablets by mouth daily.    [provider]    Family History Family History  Problem Relation Age of Onset  . Von Willebrand disease Brother   . Stroke Maternal Grandfather     Social History Social History  Substance Use Topics  . Smoking status: Never Smoker  . Smokeless tobacco: Never Used  . Alcohol use Yes     Allergies   Patient has no known allergies.   Review of Systems Review of Systems  HENT: Positive for congestion and facial swelling.   All other systems reviewed and are negative.    Physical Exam Triage Vital Signs ED Triage Vitals [10/06/16 1418]  Enc Vitals Group     BP (!) 98/56     Pulse Rate 62     Resp 16     Temp 97.7 F (36.5 C)     Temp Source Oral     SpO2 100 %     Weight  Height      Head Circumference      Peak Flow      Pain Score      Pain Loc      Pain Edu?      Excl. in [place]?    No data found.   Updated Vital Signs BP (!) 98/56 (BP Location: Left Arm)   Pulse 62   Temp 97.7 F (36.5 C) (Oral)   Resp 16   SpO2 100%    Physical Exam  Constitutional: She is oriented to person, place, and time. She appears well-developed and well-nourished.  HENT:  Head: Normocephalic.  Right Ear: External ear normal.  Left Ear: External ear normal.  Mouth/Throat: Oropharynx is clear and moist.  Eyes: Conjunctivae and EOM are normal. Pupils are equal, round, and reactive to light.  Neck: Normal range of motion. Neck supple.  Pulmonary/Chest: Effort normal.  Musculoskeletal: Normal range of motion.  Neurological: She is alert and oriented to person, place, and time.  Skin: Skin is  warm and dry.  Nursing note and vitals reviewed.    UC Treatments / Results  Labs (all labs ordered are listed, but only abnormal results are displayed) Labs Reviewed - No data to display  EKG  EKG Interpretation None       Radiology No results found.  Procedures Procedures (including critical care time)  Medications Ordered in UC Medications - No data to display   Initial Impression / Assessment and Plan / UC Course  I have reviewed the triage vital signs and the nursing notes.  Pertinent labs & imaging results that were available during my care of the patient were reviewed by me and considered in my medical decision making (see chart for details).     Final Clinical Impressions(s) / UC Diagnoses   Final diagnoses:  Acute recurrent maxillary sinusitis    New Prescriptions New Prescriptions   AMOXICILLIN-CLAVULANATE (AUGMENTIN) 875-125 MG TABLET    Take 1 tablet by mouth every 12 (twelve) hours.     Robyn Haber, MD 10/06/16 [phone]

## 2016-10-06 NOTE — ED Triage Notes (Signed)
Patient seen by dr Joseph Art only

## 2016-12-09 ENCOUNTER — Telehealth: Payer: Self-pay | Admitting: Cardiovascular Disease

## 2016-12-09 NOTE — Telephone Encounter (Signed)
Received records from [place] for appointment on 12/10/16 with Dr Oval Linsey.  Records put with Dr Blenda Mounts schedule for 12/10/16.

## 2016-12-10 ENCOUNTER — Ambulatory Visit (INDEPENDENT_AMBULATORY_CARE_PROVIDER_SITE_OTHER): Payer: 59 | Admitting: Cardiovascular Disease

## 2016-12-10 ENCOUNTER — Encounter: Payer: Self-pay | Admitting: Cardiovascular Disease

## 2016-12-10 VITALS — BP 112/70 | HR 71 | Ht 62.0 in | Wt 120.0 lb

## 2016-12-10 DIAGNOSIS — R002 Palpitations: Secondary | ICD-10-CM | POA: Insufficient documentation

## 2016-12-10 HISTORY — DX: Palpitations: R00.2

## 2016-12-10 NOTE — Patient Instructions (Signed)
Medication Instructions:  No changes  Labwork: TSH/FT4 TODAY  Testing/Procedures: Your physician has recommended that you wear a holter monitor. Holter monitors are medical devices that record the heart's electrical activity. Doctors most often use these monitors to diagnose arrhythmias. Arrhythmias are problems with the speed or rhythm of the heartbeat. The monitor is a small, portable device. You can wear one while you do your normal daily activities. This is usually used to diagnose what is causing palpitations/syncope (passing out). [place]  Follow-Up: Your physician recommends that you schedule a follow-up appointment in: [place]  If you need a refill on your cardiac medications before your next appointment, please call your pharmacy.

## 2016-12-10 NOTE — Progress Notes (Signed)
Cardiology Office Note   Date:  12/10/2016   ID:  Ashley Mendez, DOB [birth year], MRN [id number]  PCP:  Dianna Rossetti, NP  Cardiologist:   Skeet Latch, MD  OB/GYN:  Dr. Jerelyn Charles  Chief Complaint  Patient presents with  . Follow-up    NP  . Palpitations  . Chest Pain    Tightness while she was at her PCP.     History of Present Illness: Ashley Mendez is a 36 y.o. female who is being seen today for the evaluation of palpitations at the request of Jerelyn Charles, MD.  Ashley Mendez reported palpitations to her PCP and reportedly had a normal EKG. However she reports that her NP noted an irregular heart rate on exam and saw 4 irregular heart beats.  She was reassured that there were no dangerous findings.  She had labs at that time that revealed normal electrolytes and blood counts. She again reported palpitations to Dr. Carlis Abbott, her OB/GYN and requested referral to cardiology.  For the last month she has been experiencing daily palpitations that occur multiple times per day.  It is worse between 10am and 3 pm.  She feels a skipped beat and very strong heart beats.  It feels like a baby kicking inside her chest. It is most common when she is sitting or laying on her left side.  She exercises regularly and has no exertional symptoms.  She also denies lower extremity edema, orthopnea or PND.  She wonders if it may hormone related.  She had a Mirena IUD implanted 01/2016.  In the past she experienced lightheadedness around her period but none since 2011.    Ashley Mendez drinks 1-2 cups of coffee daily.  The palpitations are worse when she drinks higher quantities.  She does not use any over the counter cold or cough medication regularly.  She denies lower extremity edema, orthopnea or PND.  Of note, around the time the palpitations recurred she started back working as a Engineer, water.  She has two children ages one and two.   Past Medical History:  Diagnosis Date  . HSV (herpes simplex  virus) anogenital infection   . Hx of varicella   . Medical history non-contributory   . Palpitation 12/10/2016    Past Surgical History:  Procedure Laterality Date  . CESAREAN SECTION N/A 12/06/2014   Procedure: CESAREAN SECTION;  Surgeon: Jerelyn Charles, MD;  Location: [facility];  Service: Obstetrics;  Laterality: N/A;  . CESAREAN SECTION N/A 11/29/2015   Procedure: REPEAT CESAREAN SECTION, TWO DESSEL CORDS;  Surgeon: Jerelyn Charles, MD;  Location: [facility];  Service: Obstetrics;  Laterality: N/A;     Current Outpatient Prescriptions  Medication Sig Dispense Refill  . amoxicillin-clavulanate (AUGMENTIN) 875-125 MG tablet Take 1 tablet by mouth every 12 (twelve) hours. 14 tablet 0  . docusate sodium (COLACE) 100 MG capsule Take 1 capsule (100 mg total) by mouth 2 (two) times daily. 60 capsule 0  . hydrocortisone 2.5 % cream Apply 1 application topically 2 (two) times daily as needed (itching).     Marland Kitchen ibuprofen (ADVIL,MOTRIN) 600 MG tablet Take 1 tablet (600 mg total) by mouth every 6 (six) hours as needed. 90 tablet 0  . LIDOCAINE, ANORECTAL, EX Apply topically.    Marland Kitchen oxyCODONE-acetaminophen (ROXICET) 5-325 MG tablet Take 1-2 tablets by mouth every 4 (four) hours as needed for severe pain. 46 tablet 0  . Prenatal Vit-Min-FA-Fish Oil ([facility] PRENATAL GUMMY PO) Take 2 tablets by mouth daily.  No current facility-administered medications for this visit.     Allergies:   Patient has no known allergies.    Social History:  The patient  reports that she has never smoked. She has never used smokeless tobacco. She reports that she drinks alcohol. She reports that she does not use drugs.   Family History:  The patient's family history includes Atrial fibrillation in her father; Heart attack in her paternal grandfather; Stroke in her maternal grandfather; Von Willebrand disease in her brother.    ROS:  Please see the history of present illness.   Otherwise, review of systems are positive for  none.   All other systems are reviewed and negative.    PHYSICAL EXAM: VS:  BP 112/70   Pulse 71   Ht 5\' 2"  (1.575 m)   Wt 54.4 kg (120 lb)   BMI 21.95 kg/m  , BMI Body mass index is 21.95 kg/m. GENERAL:  Well appearing HEENT:  Pupils equal round and reactive, fundi not visualized, oral mucosa unremarkable NECK:  No jugular venous distention, waveform within normal limits, carotid upstroke brisk and symmetric, no bruits, no thyromegaly LYMPHATICS:  No cervical adenopathy LUNGS:  Clear to auscultation bilaterally HEART:  RRR.  PMI not displaced or sustained,S1 and S2 within normal limits, no S3, no S4, no clicks, no rubs, I/VI systolic flow murmur ABD:  Flat, positive bowel sounds normal in frequency in pitch, no bruits, no rebound, no guarding, no midline pulsatile mass, no hepatomegaly, no splenomegaly EXT:  2 plus pulses throughout, no edema, no cyanosis no clubbing SKIN:  No rashes no nodules NEURO:  Cranial nerves II through XII grossly intact, motor grossly intact throughout PSYCH:  Cognitively intact, oriented to person place and time    EKG:  EKG is ordered today. The ekg ordered today demonstrates sinus rhythm. Rate 71 bpm.   Recent Labs: No results found for requested labs within last 8760 hours.   11/13/16: Sodium 139, potassium 4.1, BUN 8, creatinine 0.58 AST 17, ALT 14 Total cholesterol 97, triglycerides 69, HDL 53, LDL 30  Lipid Panel No results found for: CHOL, TRIG, HDL, CHOLHDL, VLDL, LDLCALC, LDLDIRECT    Wt Readings from Last 3 Encounters:  12/10/16 54.4 kg (120 lb)  11/21/15 65.8 kg (145 lb)  11/27/15 66 kg (145 lb 7 oz)      ASSESSMENT AND PLAN:  # Palpitations: Symptoms seem most consistent with PVCs.  Labs were unremarkable.  Check a TSH/free T4.  We will get a copy of these records as well as her EKG.  We will get a 24 hour Holter to better assess.  Given that it started when she went back to work and gets worse when she thinks about the  palpitations, I suspect that anxiety may be contributing.     Current medicines are reviewed at length with the patient today.  The patient does not have concerns regarding medicines.  The following changes have been made:  no change  Labs/ tests ordered today include:   Orders Placed This Encounter  Procedures  . T4, free  . TSH  . Holter monitor - 24 hour  . EKG 12-Lead     Disposition:   FU with Vernis Eid C. Oval Linsey, MD, [facility] in 1 month    This note was written with the assistance of speech recognition software.  Please excuse any transcriptional errors.  Signed, Horris Speros C. Oval Linsey, MD, [facility]  12/10/2016 4:36 PM    [facility]

## 2016-12-11 LAB — T4, FREE: Free T4: 1.3 ng/dL (ref 0.82–1.77)

## 2016-12-11 LAB — TSH: TSH: 4.54 u[IU]/mL — ABNORMAL HIGH (ref 0.450–4.500)

## 2016-12-16 ENCOUNTER — Telehealth: Payer: Self-pay | Admitting: Cardiovascular Disease

## 2016-12-16 NOTE — Telephone Encounter (Signed)
ROI faxed to Dianna Rossetti NP. ab  Forwarded records to Dr. Oval Linsey. 12/16/16/ab

## 2016-12-24 ENCOUNTER — Ambulatory Visit (INDEPENDENT_AMBULATORY_CARE_PROVIDER_SITE_OTHER): Payer: 59

## 2016-12-24 DIAGNOSIS — R002 Palpitations: Secondary | ICD-10-CM | POA: Diagnosis not present

## 2017-01-21 ENCOUNTER — Encounter: Payer: Self-pay | Admitting: Cardiovascular Disease

## 2017-01-21 ENCOUNTER — Ambulatory Visit (INDEPENDENT_AMBULATORY_CARE_PROVIDER_SITE_OTHER): Payer: 59 | Admitting: Cardiovascular Disease

## 2017-01-21 DIAGNOSIS — I491 Atrial premature depolarization: Secondary | ICD-10-CM

## 2017-01-21 DIAGNOSIS — I493 Ventricular premature depolarization: Secondary | ICD-10-CM

## 2017-01-21 NOTE — Progress Notes (Signed)
Cardiology Office Note   Date:  01/26/2017   ID:  Ashley Mendez, DOB [birth year], MRN [id number]  PCP:  Dianna Rossetti, NP  Cardiologist:   Skeet Latch, MD  OB/GYN:  Dr. Jerelyn Charles  No chief complaint on file.    History of Present Illness: Ashley Mendez is a 36 y.o. female with PACs and PVCs here for follow up. She was initially seen 12/2016 for the evaluation of palpitations.  She reported palpitations and was noted to have ectopy on exam with her PCP E. She was referred to cardiology and had a 24-hour Holter that revealed occasional PACs and PVCs. At the time she had recently returned back to work after being on maternity leave. She did have significant stress. She has 2 young children ages one and 2. Since her last appointment she has been feeling well. She continues to have occasional palpitations but they're better-controlled.  She also has not noted any lower extremity edema, orthopnea, PND, lightheadedness, or dizziness. She is very active but does not get much formal exercise.   Palp much beter Past Medical History:  Diagnosis Date  . HSV (herpes simplex virus) anogenital infection   . Hx of varicella   . Medical history non-contributory   . PAC (premature atrial contraction) 01/26/2017  . Palpitation 12/10/2016  . PVC (premature ventricular contraction) 01/26/2017    Past Surgical History:  Procedure Laterality Date  . CESAREAN SECTION N/A 12/06/2014   Procedure: CESAREAN SECTION;  Surgeon: Jerelyn Charles, MD;  Location: [facility];  Service: Obstetrics;  Laterality: N/A;  . CESAREAN SECTION N/A 11/29/2015   Procedure: REPEAT CESAREAN SECTION, TWO DESSEL CORDS;  Surgeon: Jerelyn Charles, MD;  Location: [facility];  Service: Obstetrics;  Laterality: N/A;     No current outpatient prescriptions on file.   No current facility-administered medications for this visit.     Allergies:   Patient has no known allergies.    Social History:  The patient  reports that  she has never smoked. She has never used smokeless tobacco. She reports that she drinks alcohol. She reports that she does not use drugs.   Family History:  The patient's family history includes Atrial fibrillation in her father; Heart attack in her paternal grandfather; Stroke in her maternal grandfather; Von Willebrand disease in her brother.    ROS:  Please see the history of present illness.   Otherwise, review of systems are positive for none.   All other systems are reviewed and negative.    PHYSICAL EXAM: VS:  BP 102/65   Pulse 64   Ht 5\' 2"  (1.575 m)   Wt 55.1 kg (121 lb 6.4 oz)   SpO2 95%   BMI 22.20 kg/m  , BMI Body mass index is 22.2 kg/m. GENERAL:  Well appearing HEENT:  Pupils equal round and reactive, fundi not visualized, oral mucosa unremarkable NECK:  No jugular venous distention, waveform within normal limits, carotid upstroke brisk and symmetric, no bruits, no thyromegaly LYMPHATICS:  No cervical adenopathy LUNGS:  Clear to auscultation bilaterally HEART:  RRR.  PMI not displaced or sustained,S1 and S2 within normal limits, no S3, no S4, no clicks, no rubs, I/VI systolic flow murmur ABD:  Flat, positive bowel sounds normal in frequency in pitch, no bruits, no rebound, no guarding, no midline pulsatile mass, no hepatomegaly, no splenomegaly EXT:  2 plus pulses throughout, no edema, no cyanosis no clubbing SKIN:  No rashes no nodules NEURO:  Cranial nerves II through XII grossly  intact, motor grossly intact throughout PSYCH:  Cognitively intact, oriented to person place and time   EKG:  EKG is ordered today. The ekg ordered today demonstrates sinus rhythm. Rate 71 bpm.  Echo 12/24/16: 24 Hour Holter Monitor  Quality: Fair.  Baseline artifact. Predominant rhythm: sinus rhythm Average heart rate: 73 bpm Max heart rate: 114 bpm Min heart rate: 52 bpm  Occasional PACs and PVCs No arrhythmias  Recent Labs: 12/10/2016: TSH 4.540   11/13/16: Sodium 139,  potassium 4.1, BUN 8, creatinine 0.58 AST 17, ALT 14 Total cholesterol 97, triglycerides 69, HDL 53, LDL 30  Lipid Panel No results found for: CHOL, TRIG, HDL, CHOLHDL, VLDL, LDLCALC, LDLDIRECT    Wt Readings from Last 3 Encounters:  01/21/17 55.1 kg (121 lb 6.4 oz)  12/10/16 54.4 kg (120 lb)  11/21/15 65.8 kg (145 lb)      ASSESSMENT AND PLAN:  # PACs: # PVCs: Ms. Ohaver was noted to have PACs and PVCs on Holter monitoring. Laboratory testing has been unremarkable. She reports that her symptoms have been better-controlled lately and she is not interested in taking any medication.     Current medicines are reviewed at length with the patient today.  The patient does not have concerns regarding medicines.  The following changes have been made:  no change  Labs/ tests ordered today include:   No orders of the defined types were placed in this encounter.    Disposition:   FU with Raimi Guillermo C. Oval Linsey, MD, [facility] as needed.    This note was written with the assistance of speech recognition software.  Please excuse any transcriptional errors.  Signed, Annalei Friesz C. Oval Linsey, MD, [facility]  01/26/2017 9:40 AM    [place]

## 2017-01-21 NOTE — Patient Instructions (Signed)
Medication Instructions:  ?Your physician recommends that you continue on your current medications as directed. Please refer to the Current Medication list given to you today.  ? ?Labwork: ?NONE ? ?Testing/Procedures: ?NONE ? ?Follow-Up: ?AS NEEDED  ? ?  ?

## 2017-01-26 ENCOUNTER — Encounter: Payer: Self-pay | Admitting: Cardiovascular Disease

## 2017-01-26 DIAGNOSIS — I491 Atrial premature depolarization: Secondary | ICD-10-CM

## 2017-01-26 DIAGNOSIS — I493 Ventricular premature depolarization: Secondary | ICD-10-CM

## 2017-01-26 HISTORY — DX: Ventricular premature depolarization: I49.3

## 2017-01-26 HISTORY — DX: Atrial premature depolarization: I49.1

## 2017-06-04 DIAGNOSIS — L0100 Impetigo, unspecified: Secondary | ICD-10-CM | POA: Diagnosis not present

## 2017-06-04 DIAGNOSIS — D229 Melanocytic nevi, unspecified: Secondary | ICD-10-CM | POA: Diagnosis not present

## 2017-07-04 IMAGING — US US MFM FETAL BPP W/O NON-STRESS
1 series · 15 of 20 positions shown · non-contrast
Comparison: none

[Series 1: us mfm fetal bpp w/o non-stress · 20 acquisitions, 15 frames shown]
[im 1/20]
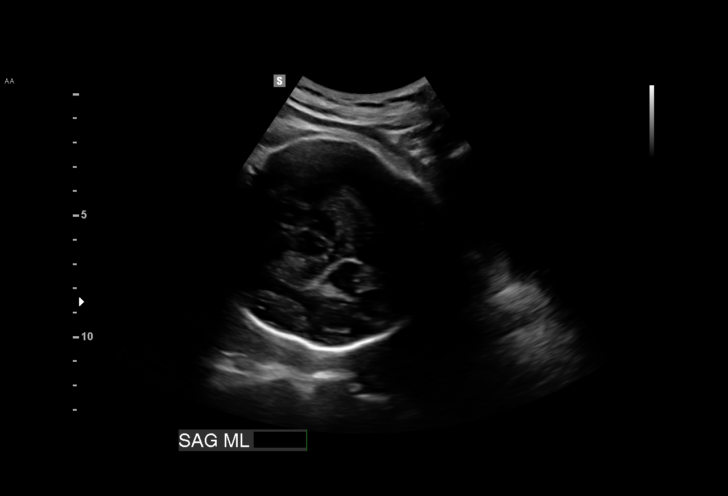
[im 3/20]
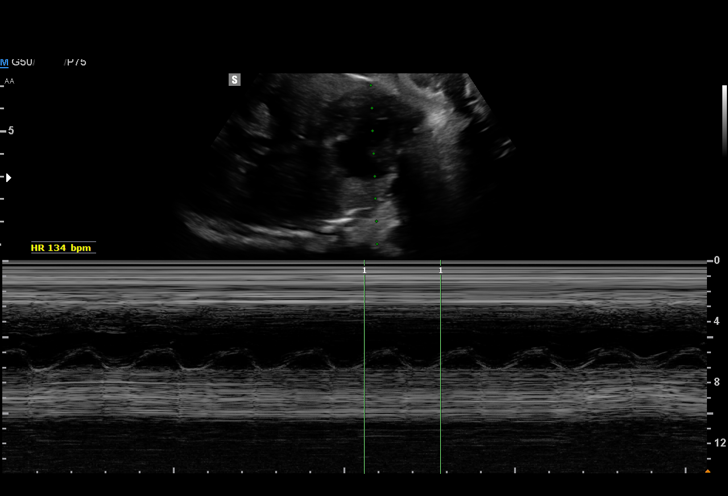
[im 4/20]
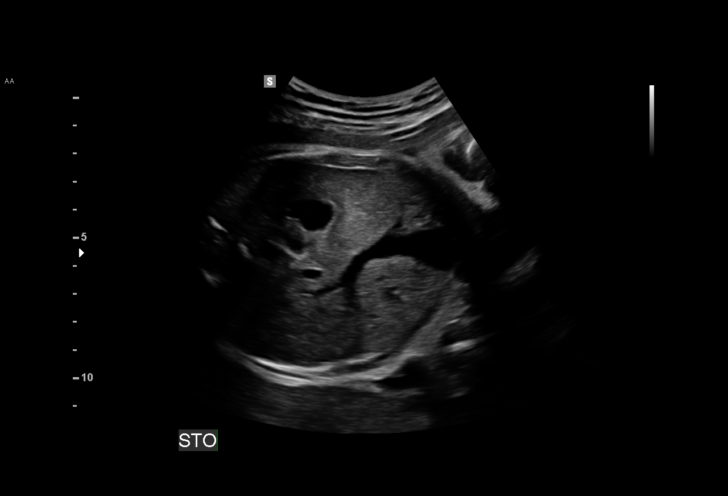
[im 5/20]
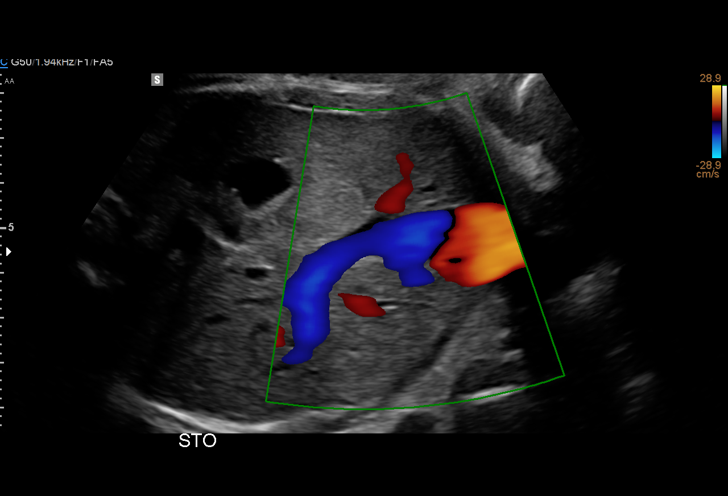
[im 7/20]
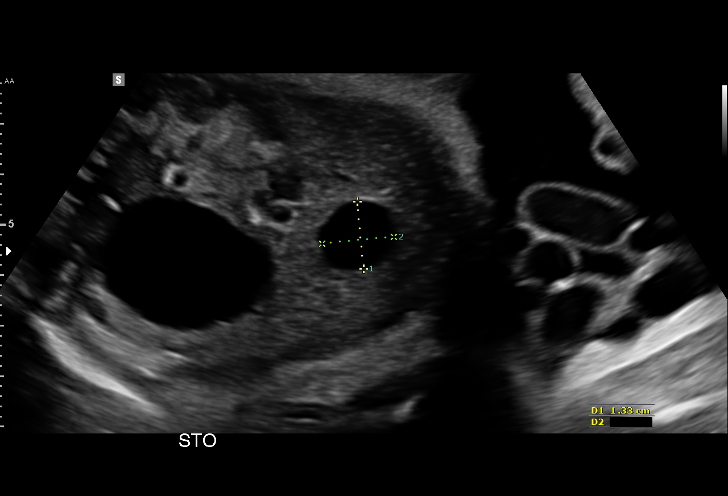
[im 8/20]
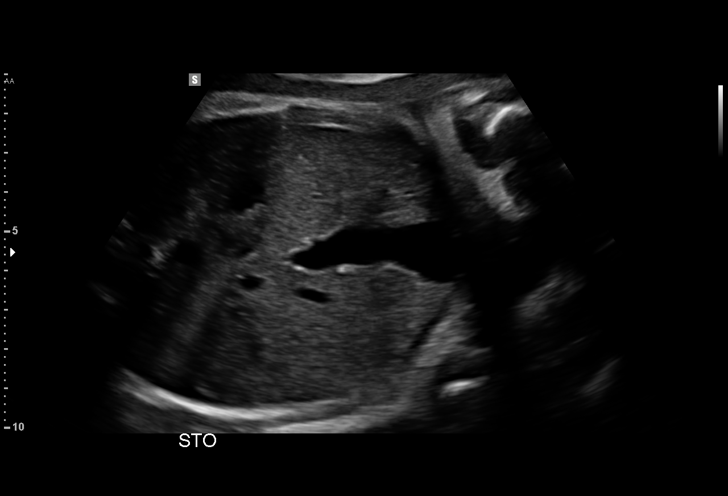
[im 9/20]
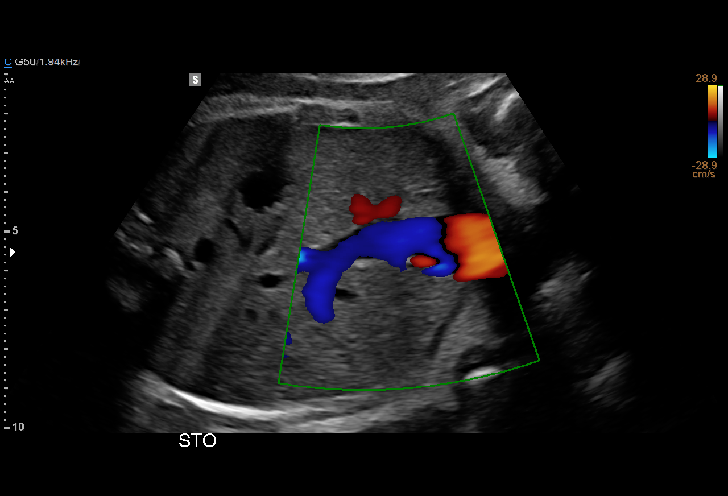
[im 11/20]
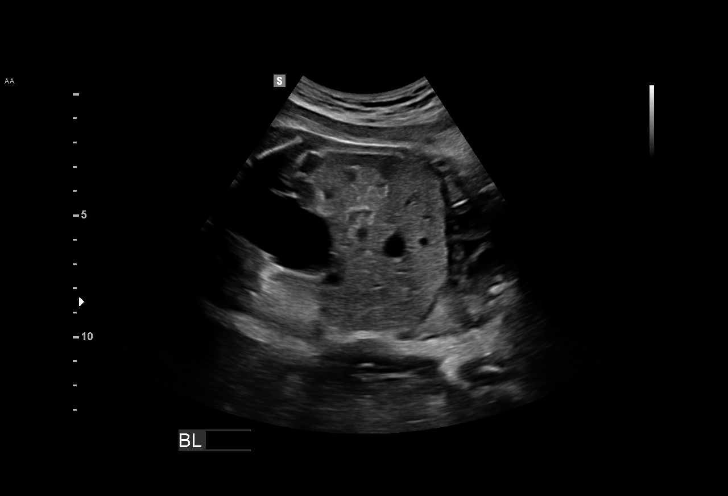
[im 12/20]
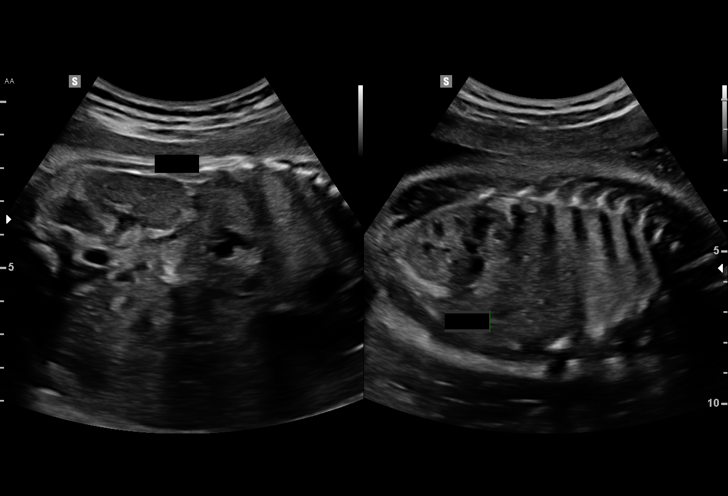
[im 13/20]
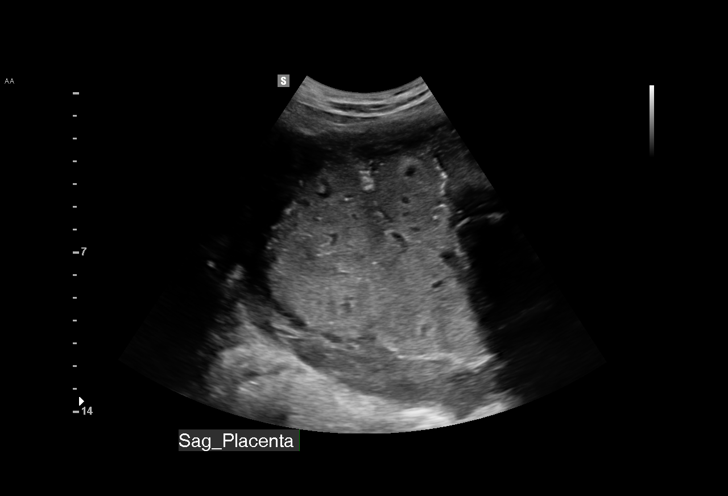
[im 15/20]
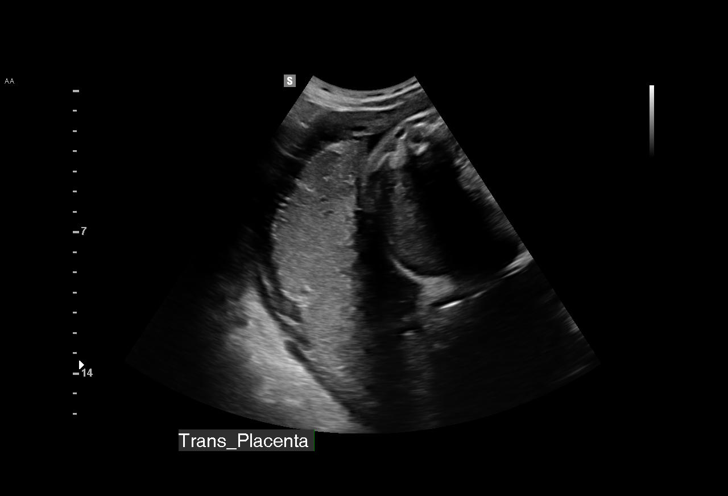
[im 16/20]
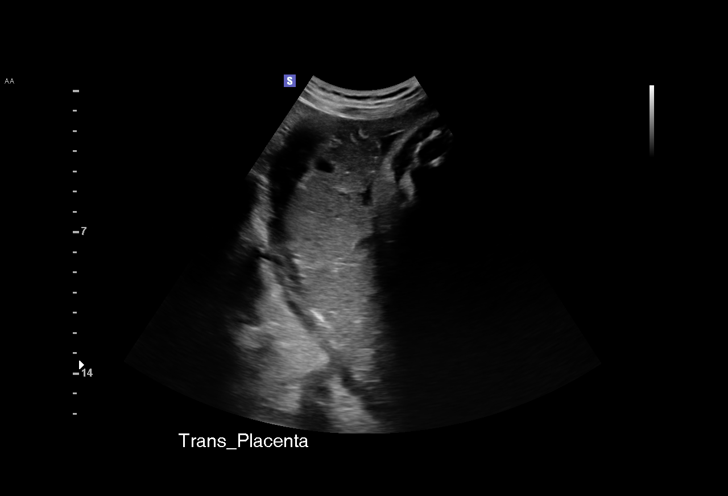
[im 17/20]
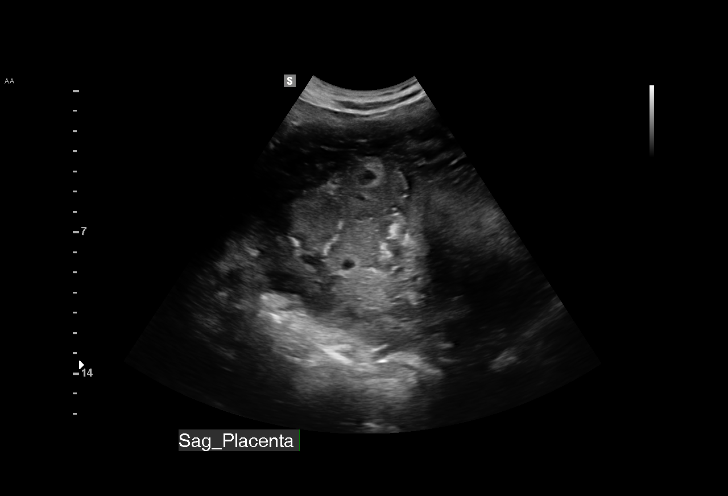
[im 19/20]
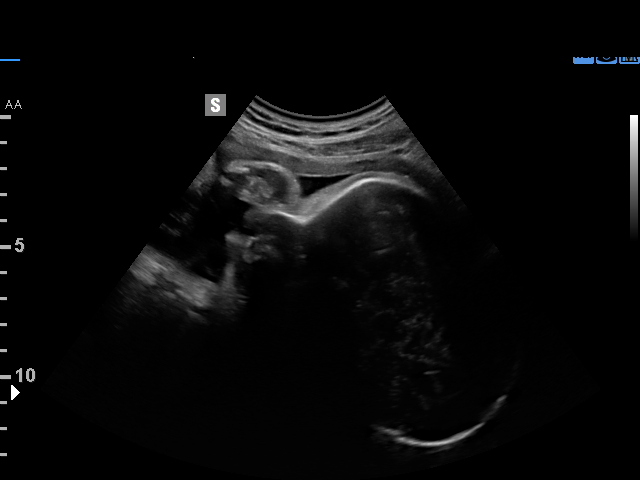
[im 20/20]
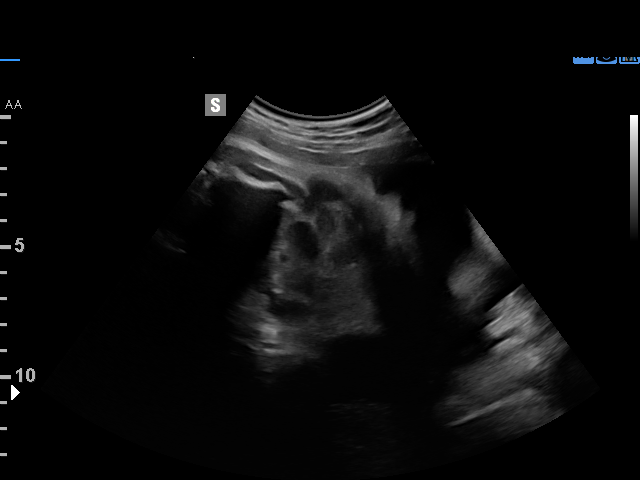

[15 of 20 positions shown; findings below may reference images not displayed]

NAHEED DO

1  GAZWA REMO            [id number]      [id number]     [id number]
Indications

33 weeks gestation of pregnancy
Umbilical vein abnormality complicating
pregnancy(UVV)
2 vessel umbilical cord
Advanced maternal age multigravida 35,
third trimester - low risk NIPS
OB History

Gravidity:    2         Term:   1
Living:       1
Fetal Evaluation

Num Of Fetuses:     1
Fetal Heart         134
Rate(bpm):
Cardiac Activity:   Observed
Presentation:       Cephalic
Placenta:           Posterior, above cervical os

Amniotic Fluid
AFI FV:      Subjectively within normal limits

AFI Sum(cm)     %Tile       Largest Pocket(cm)
12.39           37

RUQ(cm)       RLQ(cm)       LUQ(cm)        LLQ(cm)
2.6
Biophysical Evaluation
Amniotic F.V:   Within normal limits       F. Tone:        Observed
F. Movement:    Observed                   Score:          [DATE]
F. Breathing:   Observed
Gestational Age

LMP:           33w 6d       Date:   03/14/15                 EDD:   12/19/15
Best:          33w 6d    Det. By:   LMP  (03/14/15)          EDD:   12/19/15
Anatomy

Cranium:               Appears normal         Abdomen:                Umbilical vein
varix
Diaphragm:             Appears normal         Kidneys:                Appear normal
Stomach:               Appears normal, left   Bladder:                Appears normal
sided
Cervix Uterus Adnexa

Cervix
Not visualized (advanced GA >27wks)
Impression

Single IUP at 33w 6d
Follow up due to umbilical vein varix and single umbilical
artery
The umbilical vein varix is stable in size (
 1.3 cm)
No filling defects noted on color Doppler
BPP [DATE]
Normal amniotic fluid volume
Recommendations

Ultrasound for growth next week
Continue 2x weekly BPPs with color flow evaluation of the
umbilical vein varix
Delivery at 37 weeks if testing remains stable

## 2017-07-10 IMAGING — US US MFM FETAL BPP W/O NON-STRESS
1 series · 12 of 18 positions shown · non-contrast
Comparison: none

[Series 1: us mfm fetal bpp w/o non-stress · 18 acquisitions, 12 frames shown]
[im 1/18]
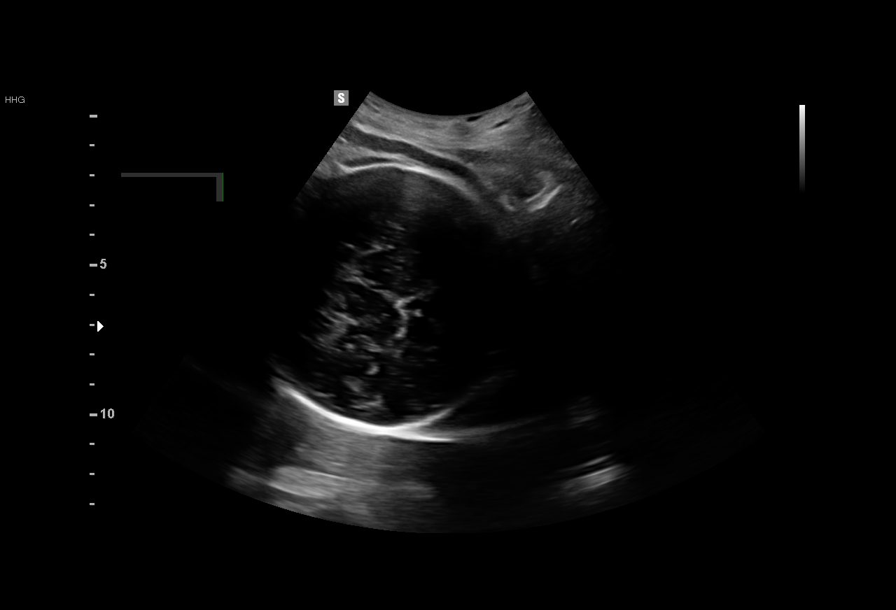
[im 3/18]
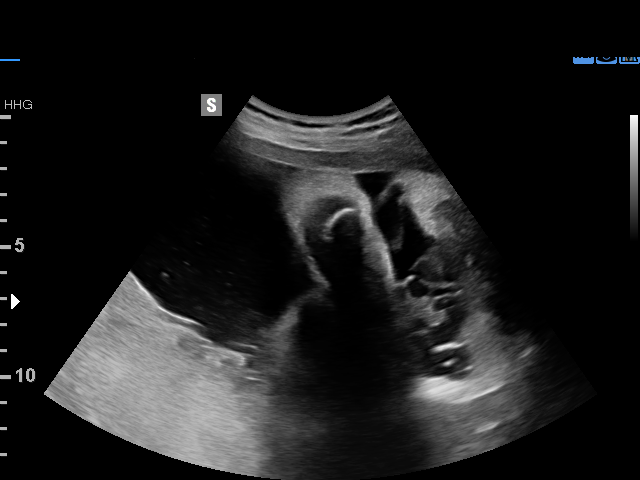
[im 4/18]
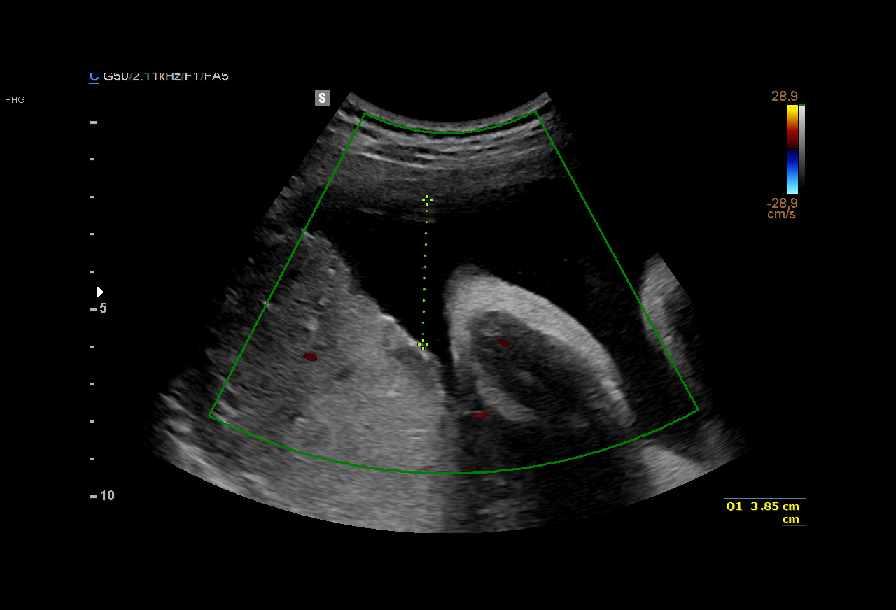
[im 6/18]
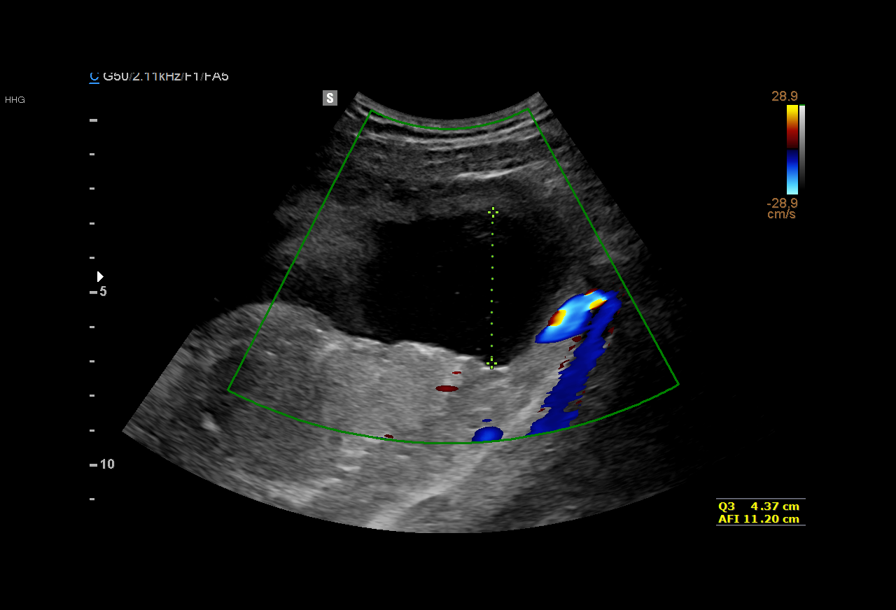
[im 7/18]
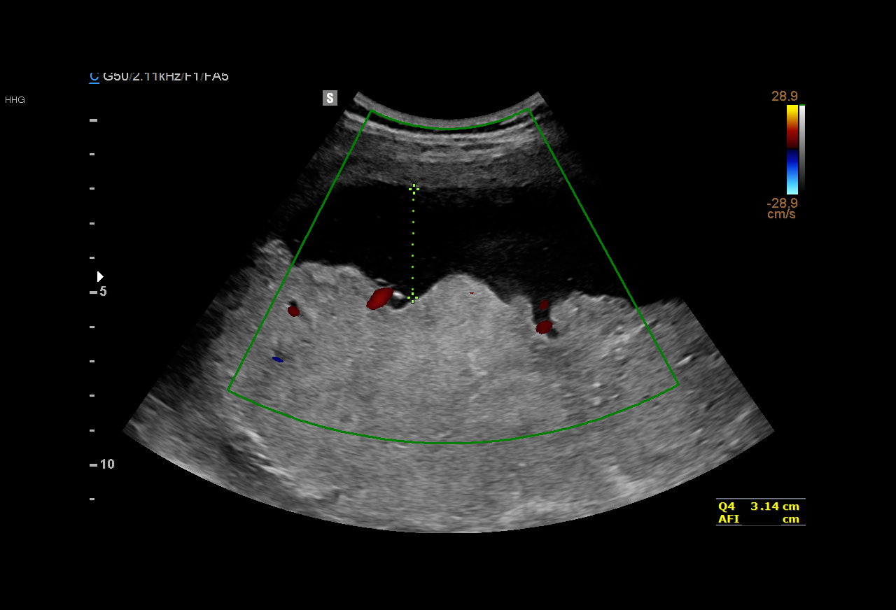
[im 9/18]
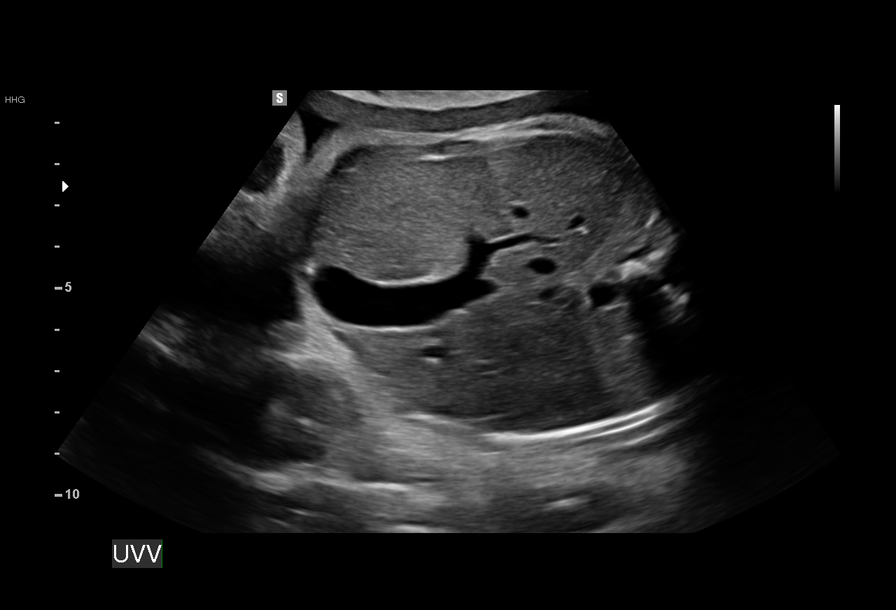
[im 10/18]
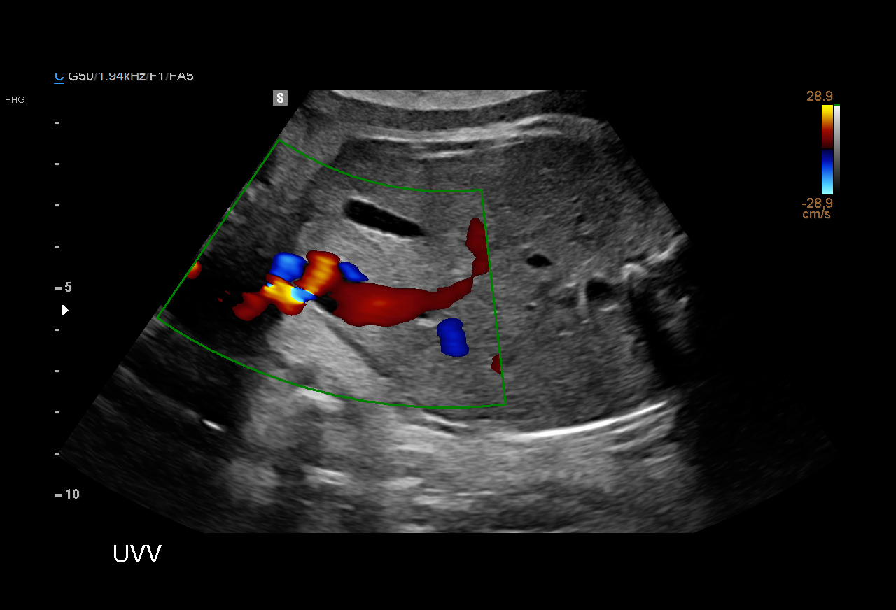
[im 12/18]
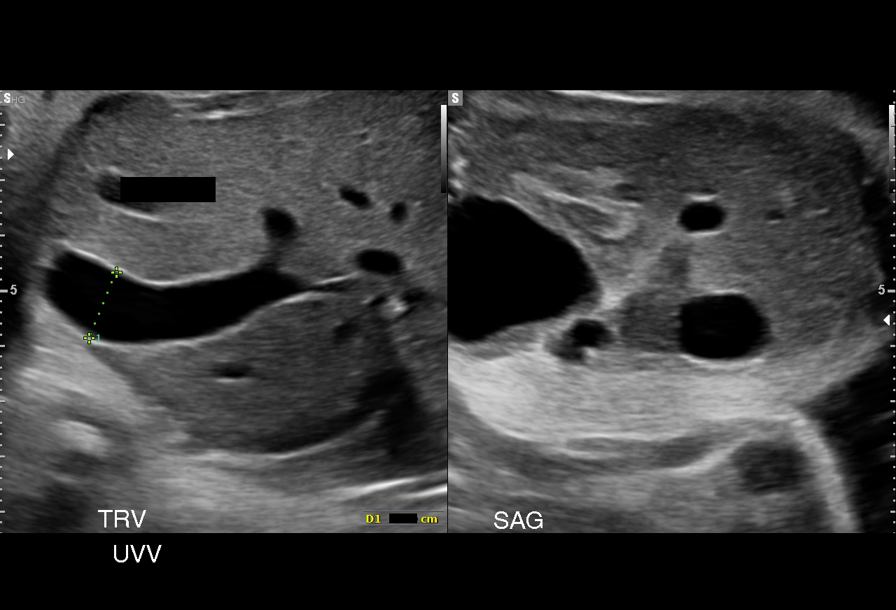
[im 13/18]
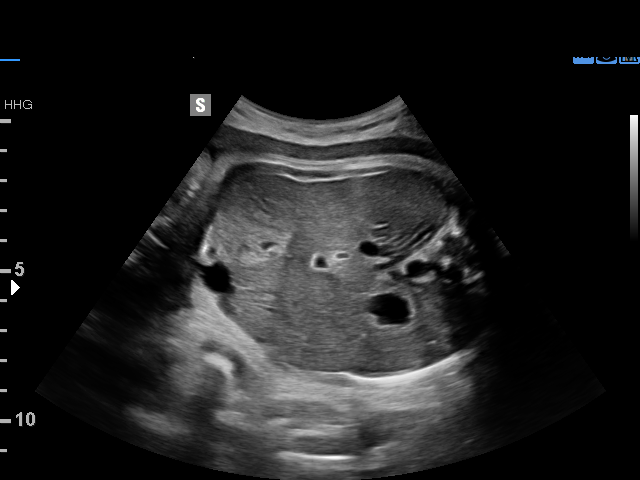
[im 15/18]
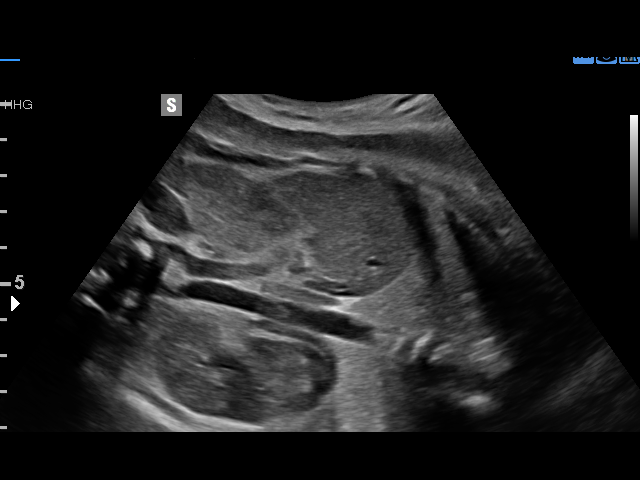
[im 16/18]
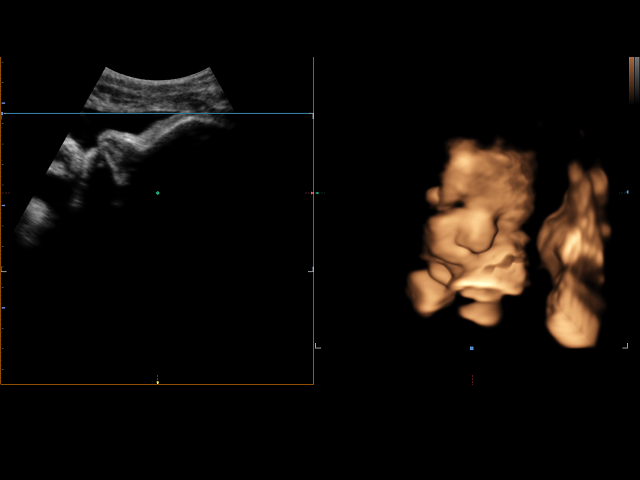
[im 18/18]
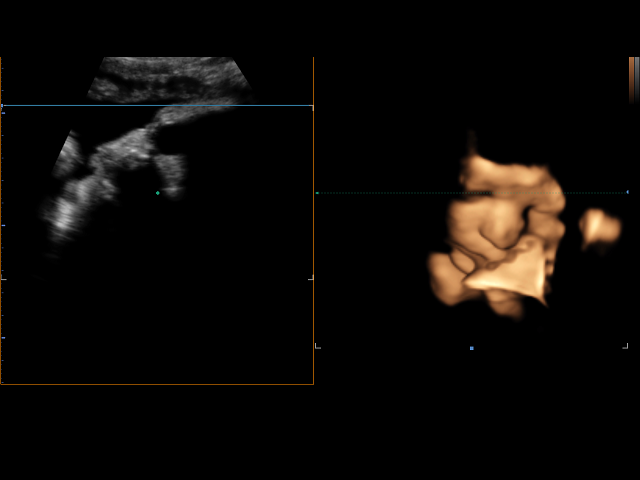

[12 of 18 positions shown; findings below may reference images not displayed]

DORDANO DO

1  WLADIMIR MUI            [id number]      [id number]     [id number]
Indications

34 weeks gestation of pregnancy
Umbilical vein abnormality complicating
pregnancy (UVV)
2 vessel umbilical cord
Advanced maternal age multigravida 35,
third trimester - low risk NIPS
OB History

Gravidity:    2         Term:   1
Living:       1
Fetal Evaluation

Num Of Fetuses:     1
Cardiac Activity:   Observed
Presentation:       Cephalic
Placenta:           Posterior Fundal, above cervical os

Amniotic Fluid
AFI FV:      Subjectively within normal limits

AFI Sum(cm)     %Tile       Largest Pocket(cm)
14.34           51

RUQ(cm)       RLQ(cm)       LUQ(cm)        LLQ(cm)
3.85

Comment:    BPP [DATE]
Biophysical Evaluation
Amniotic F.V:   Within normal limits       F. Tone:        Observed
F. Movement:    Observed                   Score:          [DATE]
F. Breathing:   Observed
Gestational Age

LMP:           34w 5d       Date:   03/14/15                 EDD:   12/19/15
Best:          34w 5d    Det. By:   LMP  (03/14/15)          EDD:   12/19/15
Impression

Single IUP at 34w 5d
Follow up due to umbilical vein varix and single umbilical
artery
The umbilical vein varix is stable in size (
 1.3 cm)
No filling defects noted on color Doppler
BPP [DATE]
Normal amniotic fluid volume
Recommendations

Continue 2x weekly BPPs with color flow evaluation of the
umbilical vein varix
Delivery at 37 weeks if testing remains stable

## 2017-07-21 IMAGING — US US MFM FETAL BPP W/O NON-STRESS
1 series · 12 of 26 positions shown · non-contrast
Comparison: none

[Series 1: us mfm fetal bpp w/o non-stress · 26 acquisitions, 12 frames shown]
[im 2/26]
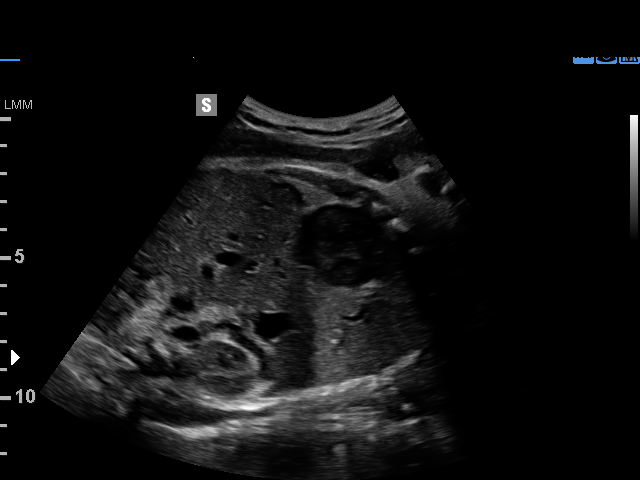
[im 4/26]
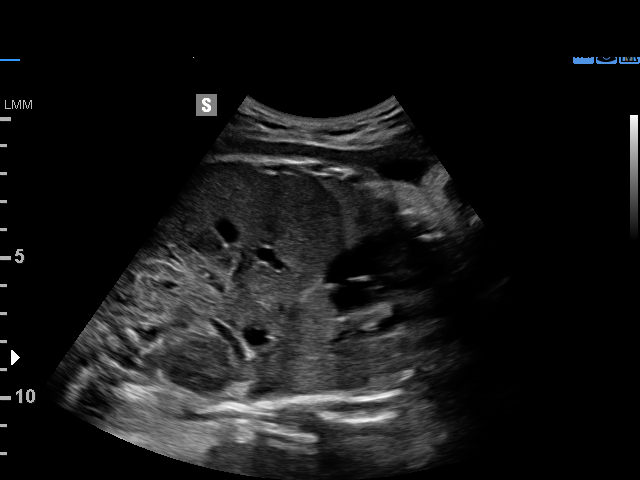
[im 6/26]
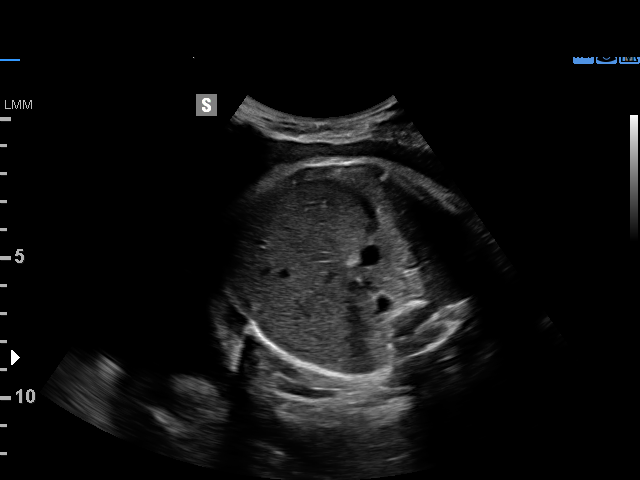
[im 8/26]
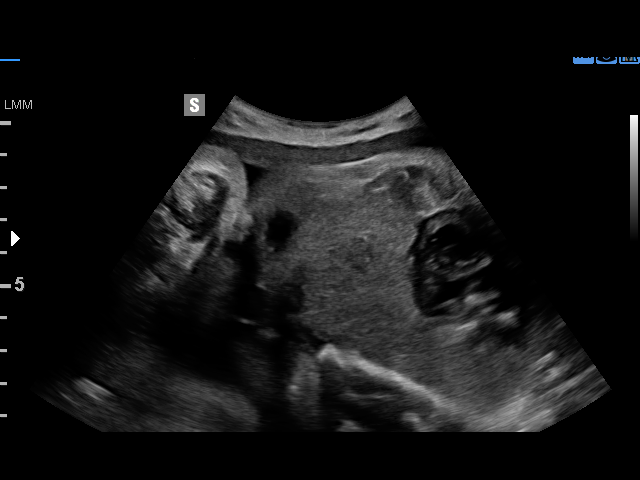
[im 10/26]
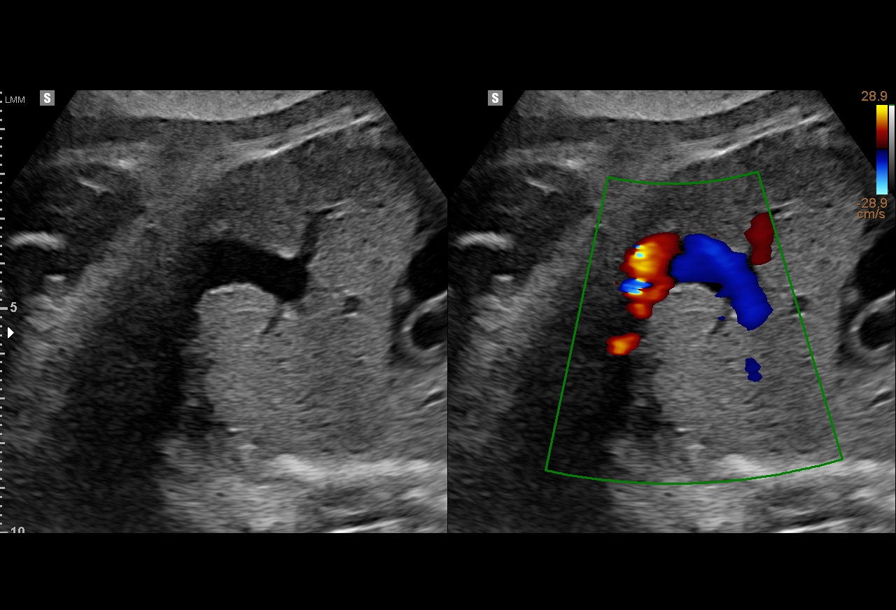
[im 12/26]
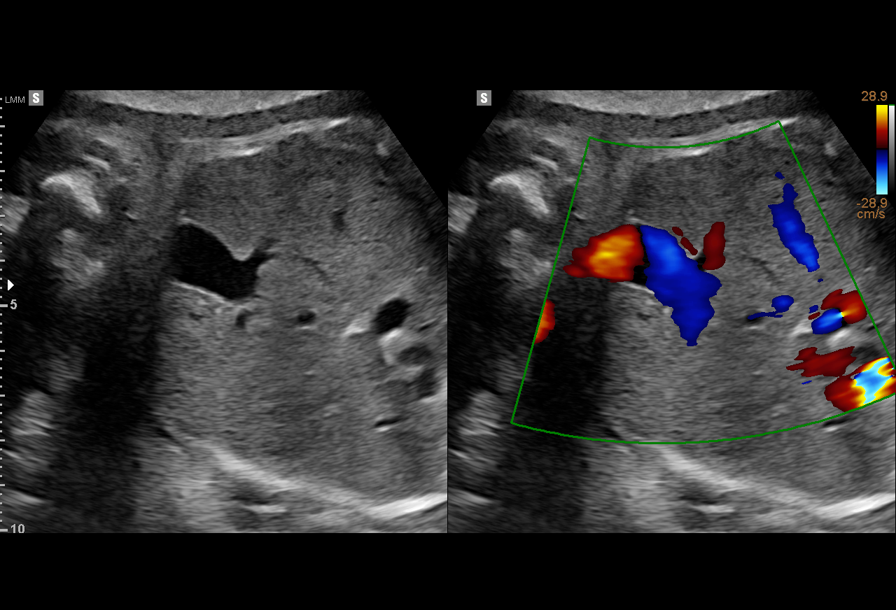
[im 15/26]
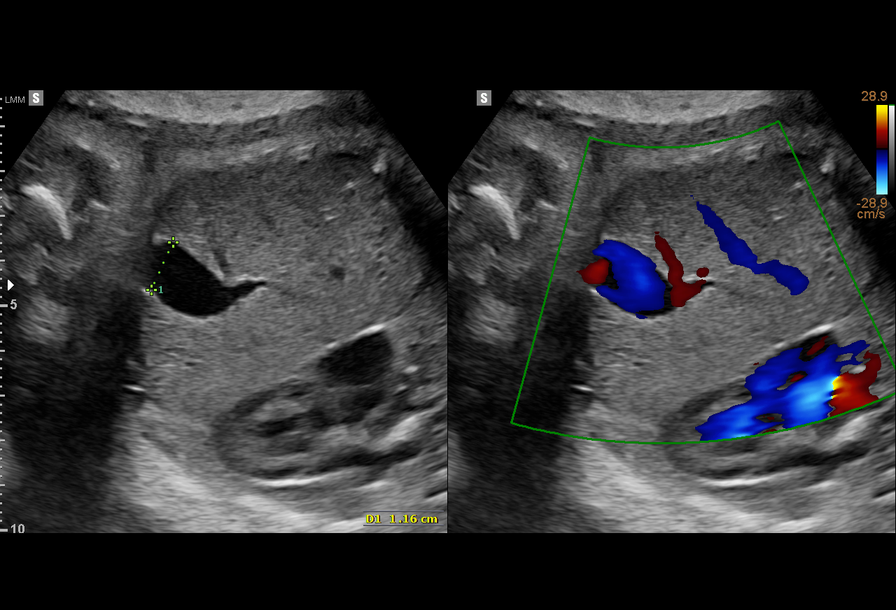
[im 17/26]
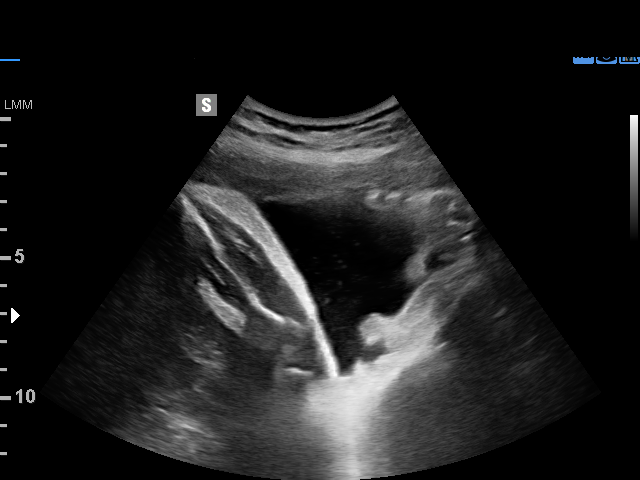
[im 19/26]
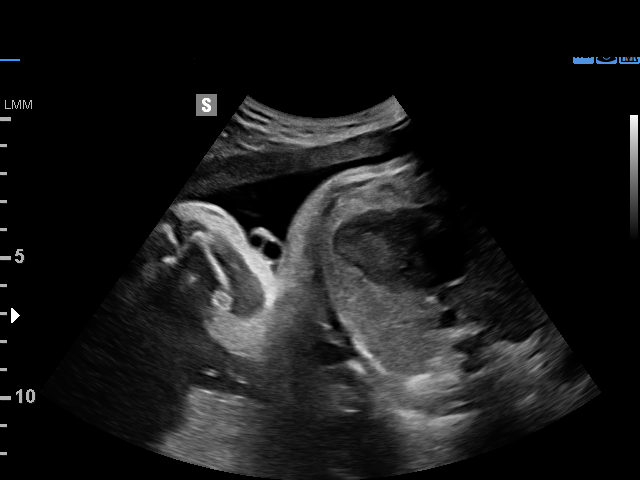
[im 21/26]
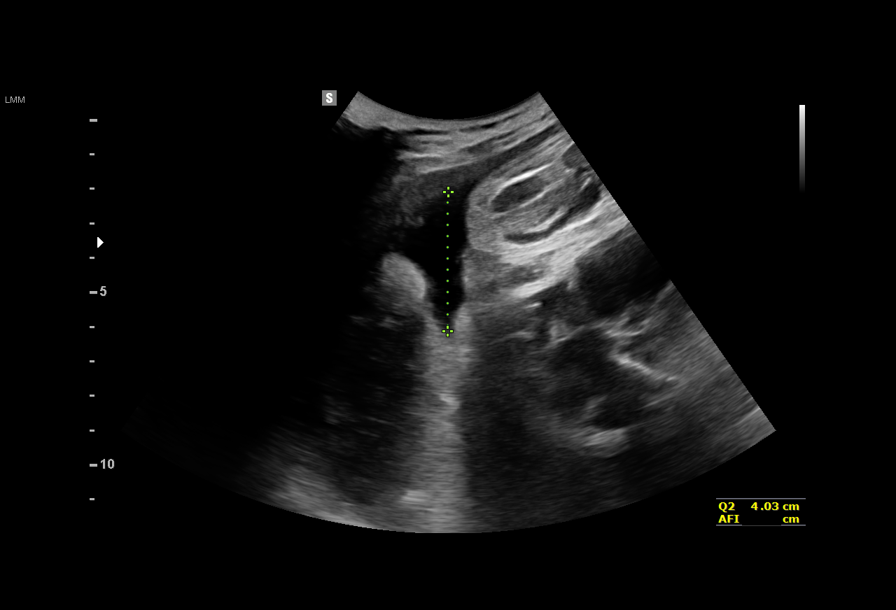
[im 23/26]
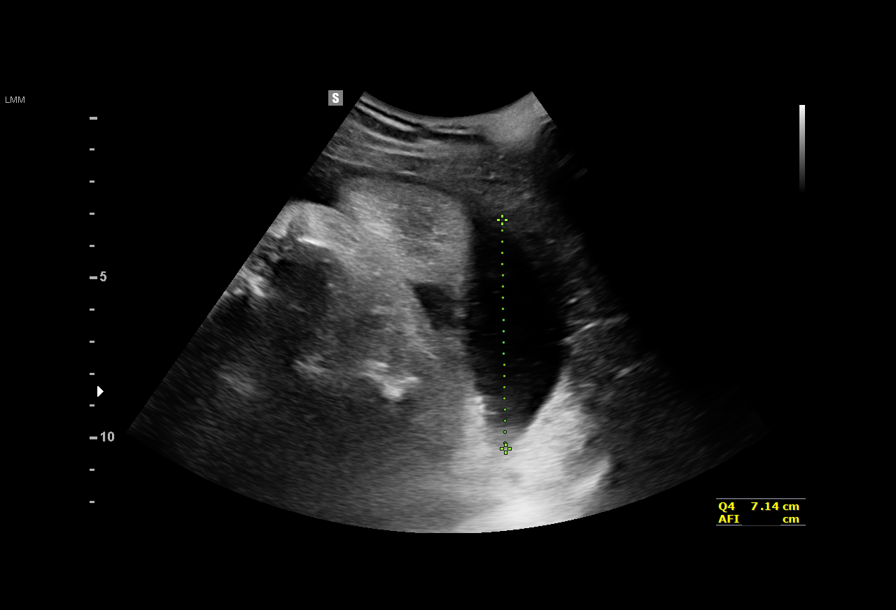
[im 25/26]
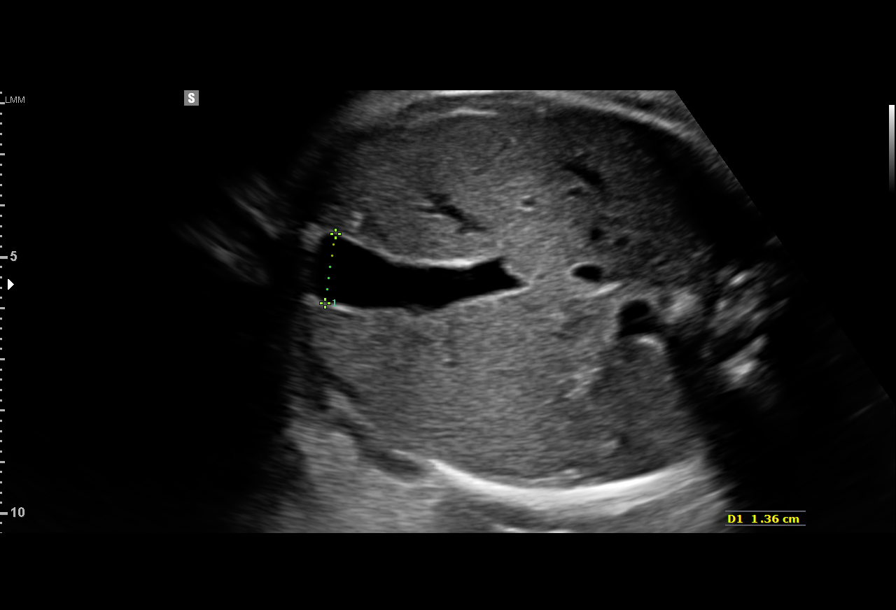

[12 of 26 positions shown; findings below may reference images not displayed]

SEDIAKGOTLA DO

1  BURNZIN DUBULA DUBULA TUNEYA            [id number]      [id number]     [id number]
Indications

36 weeks gestation of pregnancy
Umbilical vein abnormality complicating
pregnancy (UVV)
2 vessel umbilical cord
Advanced maternal age multigravida 35,
third trimester - low risk NIPS
OB History

Gravidity:    2         Term:   1
Living:       1
Fetal Evaluation

Num Of Fetuses:     1
Fetal Heart         145
Rate(bpm):
Cardiac Activity:   Observed
Presentation:       Cephalic
Placenta:           Posterior, above cervical os
P. Cord Insertion:  Previously Visualized

Amniotic Fluid
AFI FV:      Subjectively within normal limits

AFI Sum(cm)     %Tile       Largest Pocket(cm)
19.98           76

RUQ(cm)       RLQ(cm)       LUQ(cm)        LLQ(cm)
6.6
Biophysical Evaluation
Amniotic F.V:   Pocket => 2 cm two         F. Tone:         Observed
planes
F. Movement:    Observed                   Score:           [DATE]
F. Breathing:   Observed
Gestational Age

LMP:           36w 2d        Date:  03/14/15                 EDD:    12/19/15
Best:          36w 2d     Det. By:  LMP  (03/14/15)          EDD:    12/19/15
Anatomy

Abdomen:               Umbilical vein varix
Impression

SIUP at 36+2 weeks
UVV - no filling defects identified
Normal amniotic fluid volume
BPP [DATE]
Recommendations

BPP and reassessment of UVV on [REDACTED]
# 1 BMZ [REDACTED]; #2 [REDACTED]; repeat C/S planned for
[REDACTED]

## 2017-07-25 IMAGING — US US MFM FETAL BPP W/O NON-STRESS
1 series · 15 of 16 positions shown · non-contrast
Comparison: none

[Series 1: us mfm fetal bpp w/o non-stress · 16 acquisitions, 15 frames shown]
[im 1/16]
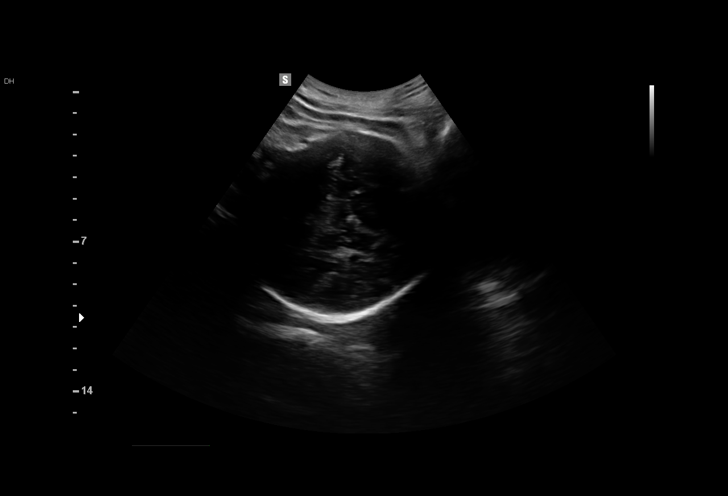
[im 2/16]
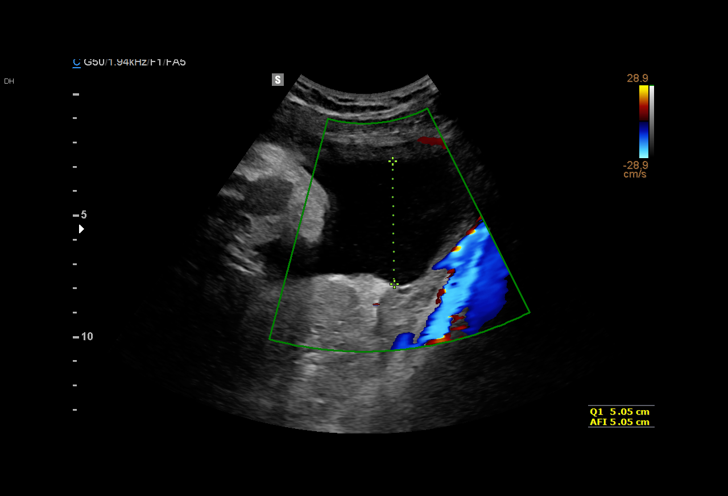
[im 3/16]
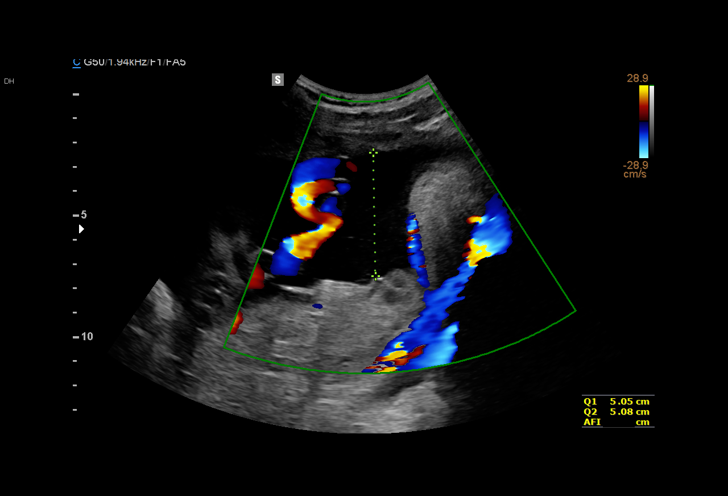
[im 4/16]
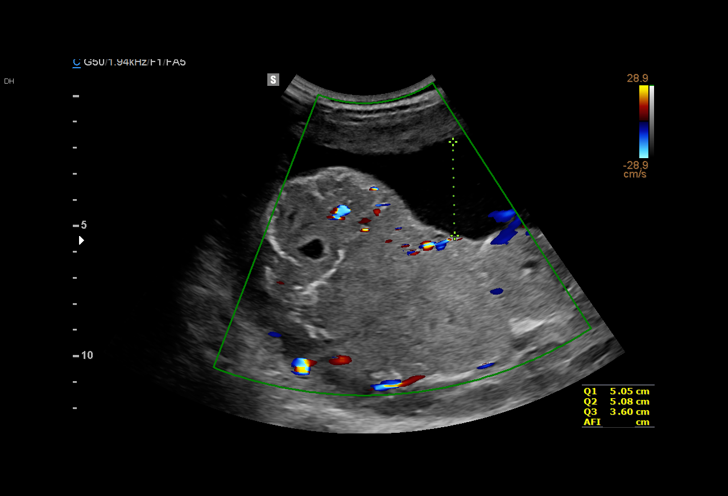
[im 5/16]
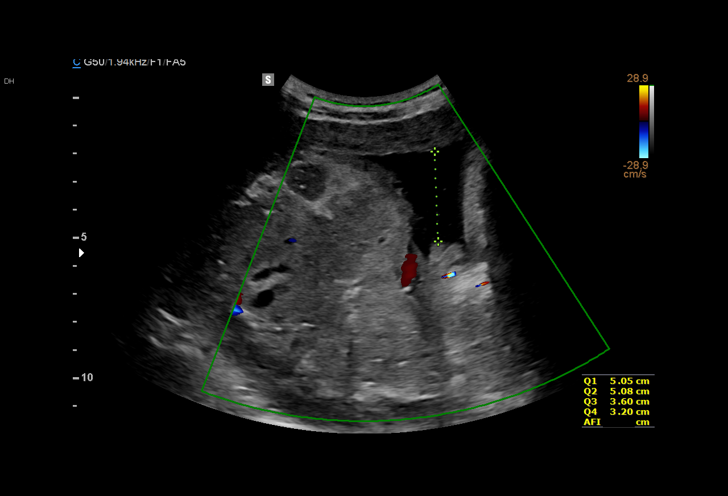
[im 6/16]
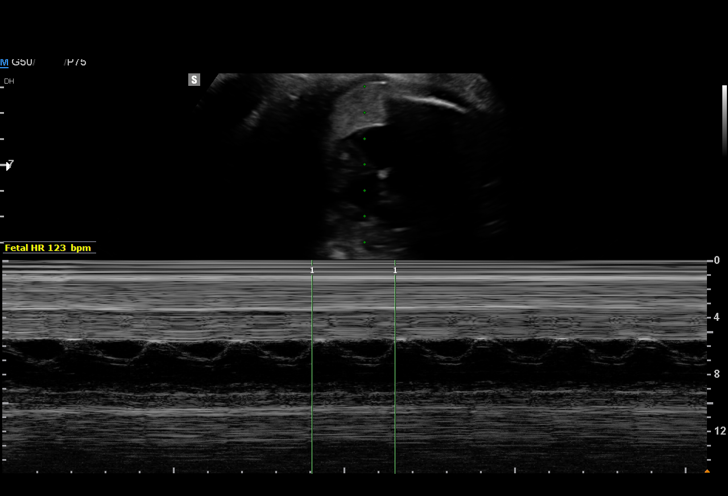
[im 7/16]
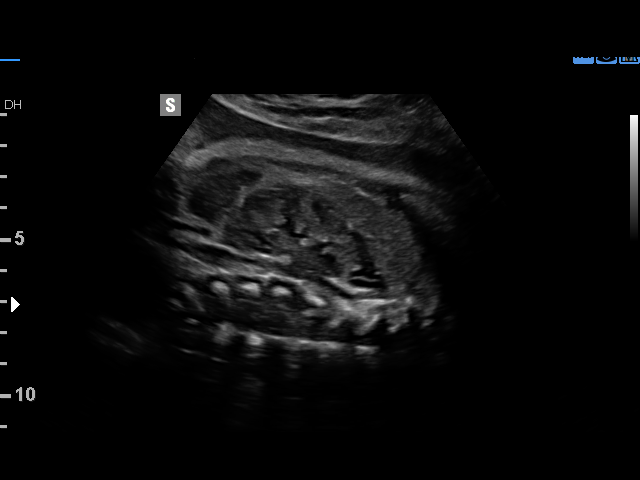
[im 9/16]
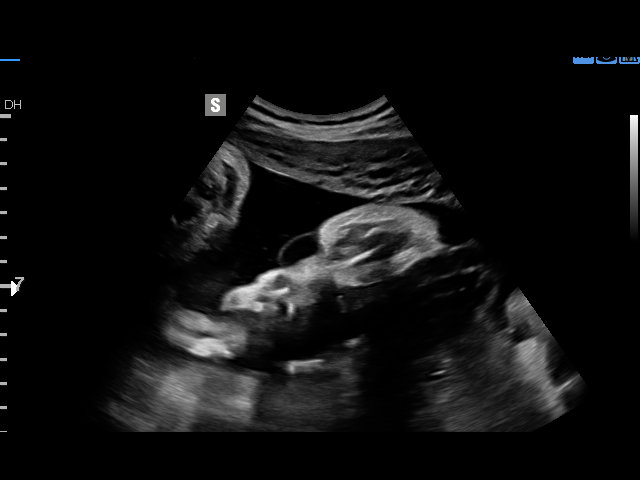
[im 10/16]
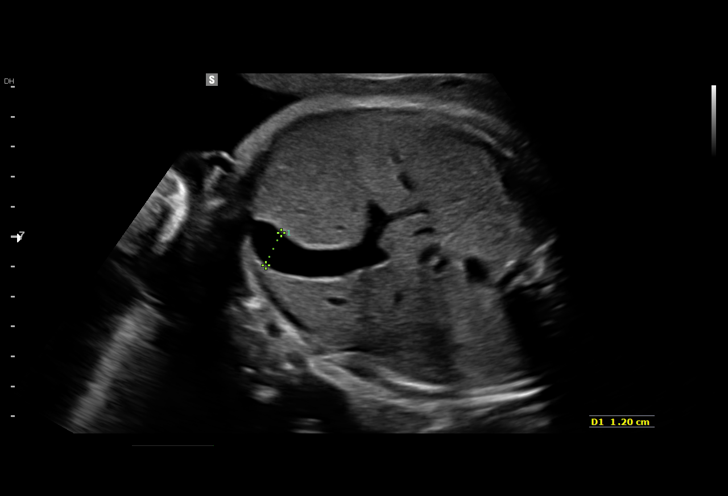
[im 11/16]
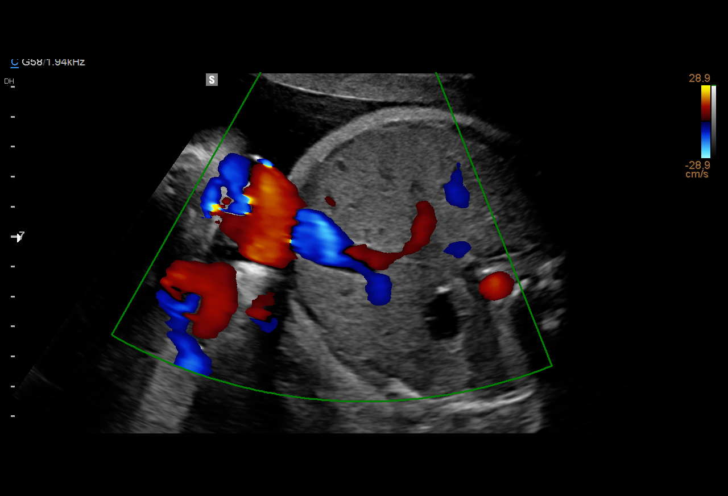
[im 12/16]
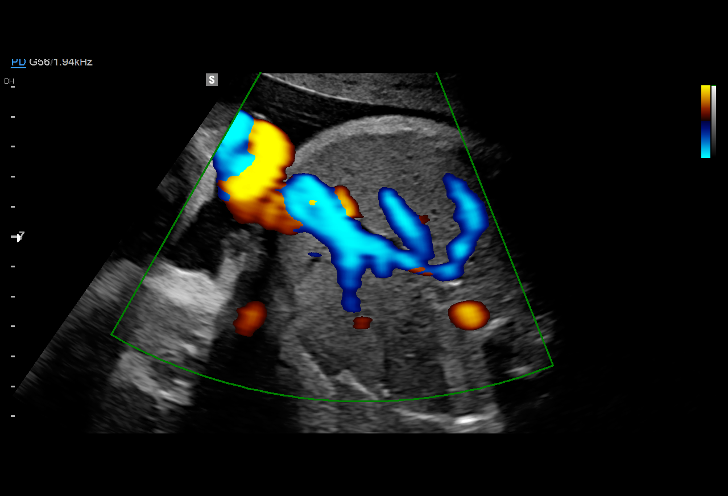
[im 13/16]
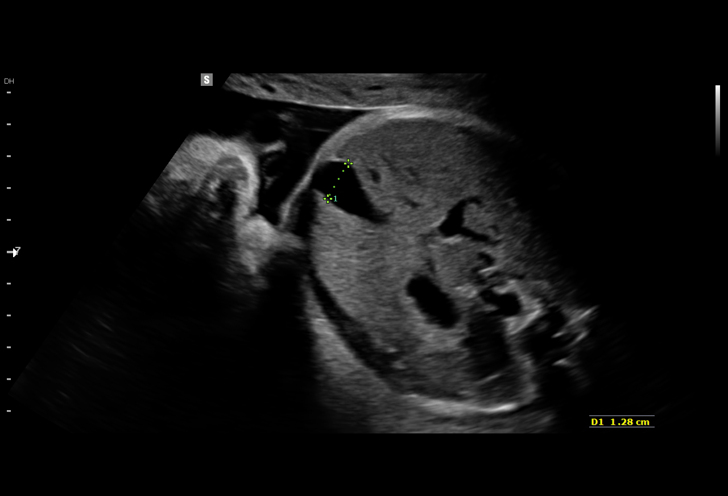
[im 14/16]
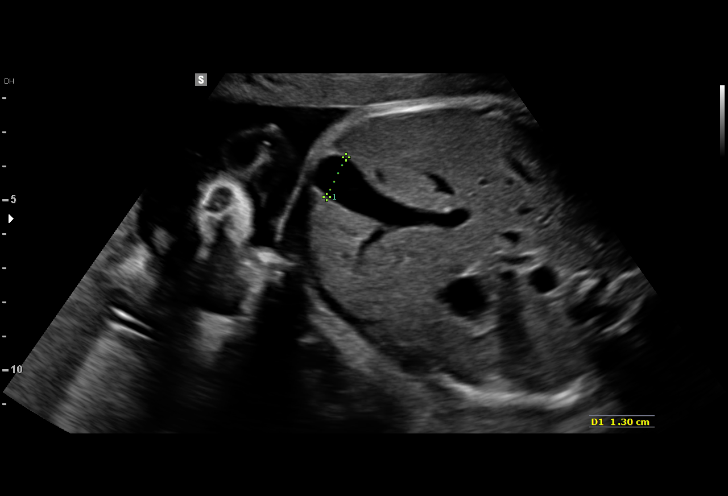
[im 15/16]
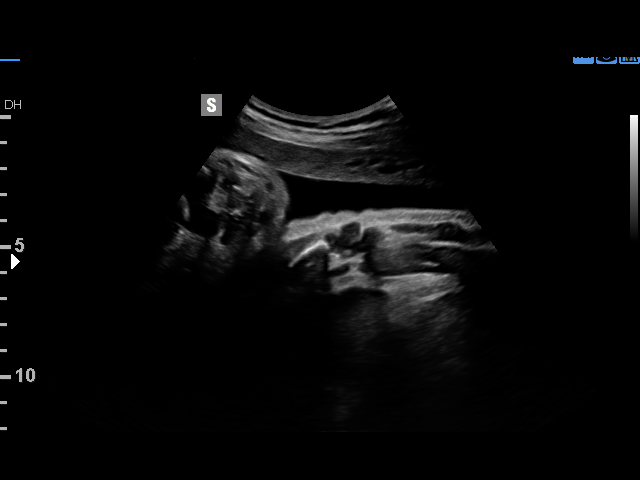
[im 16/16]
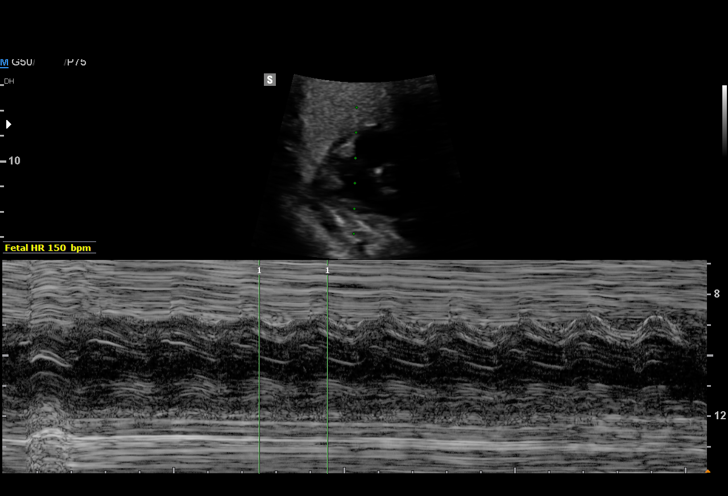

[15 of 16 positions shown; findings below may reference images not displayed]

FLETES DO

1  ALMIR ELDINA ERRATH            [id number]      [id number]     [id number]
Indications

36 weeks gestation of pregnancy
Umbilical vein abnormality complicating
pregnancy (UVV)
2 vessel umbilical cord
Advanced maternal age multigravida 35,
third trimester - low risk NIPS
BMZ [DATE], [DATE]
OB History

Gravidity:    2         Term:   1
Living:       1
Fetal Evaluation

Num Of Fetuses:     1
Fetal Heart         150
Rate(bpm):
Cardiac Activity:   Observed
Presentation:       Cephalic
Placenta:           Posterior, above cervical os

Amniotic Fluid
AFI FV:      Subjectively within normal limits

AFI Sum(cm)     %Tile       Largest Pocket(cm)
16.93           64

RUQ(cm)       RLQ(cm)       LUQ(cm)        LLQ(cm)
5.05
Biophysical Evaluation
Amniotic F.V:   Pocket => 2 cm two         F. Tone:        Observed
planes
F. Movement:    Observed                   Score:          [DATE]
F. Breathing:   Observed
Gestational Age

LMP:           36w 6d       Date:   03/14/15                 EDD:   12/19/15
Best:          36w 6d    Det. By:   LMP  (03/14/15)          EDD:   12/19/15
Impression

SIUP at 36+6 weeks
Cephalic presentation
UVV - no filling defects
Normal amniotic fluid volume
BPP [DATE]
Recommendations

For repeat C/S in 2 days

## 2017-11-06 DIAGNOSIS — Z309 Encounter for contraceptive management, unspecified: Secondary | ICD-10-CM | POA: Diagnosis not present

## 2017-11-06 DIAGNOSIS — Z30432 Encounter for removal of intrauterine contraceptive device: Secondary | ICD-10-CM | POA: Diagnosis not present

## 2018-03-12 DIAGNOSIS — H52223 Regular astigmatism, bilateral: Secondary | ICD-10-CM | POA: Diagnosis not present

## 2018-03-12 DIAGNOSIS — H524 Presbyopia: Secondary | ICD-10-CM | POA: Diagnosis not present

## 2018-03-12 DIAGNOSIS — H5202 Hypermetropia, left eye: Secondary | ICD-10-CM | POA: Diagnosis not present

## 2018-04-02 ENCOUNTER — Encounter (HOSPITAL_COMMUNITY): Payer: Self-pay

## 2018-04-02 ENCOUNTER — Ambulatory Visit (HOSPITAL_COMMUNITY)
Admission: EM | Admit: 2018-04-02 | Discharge: 2018-04-02 | Disposition: A | Discharge status: Home / Self Care | Payer: 59 | Attending: Family Medicine | Admitting: Family Medicine

## 2018-04-02 DIAGNOSIS — M545 Low back pain, unspecified: Secondary | ICD-10-CM

## 2018-04-02 DIAGNOSIS — R5383 Other fatigue: Secondary | ICD-10-CM | POA: Diagnosis not present

## 2018-04-02 DIAGNOSIS — M549 Dorsalgia, unspecified: Secondary | ICD-10-CM | POA: Diagnosis present

## 2018-04-02 LAB — POCT RAPID STREP A: Streptococcus, Group A Screen (Direct): NEGATIVE

## 2018-04-02 MED ORDER — METHOCARBAMOL 500 MG PO TABS: 500.0000 mg | ORAL_TABLET | Freq: Two times a day (BID) | ORAL | 0 refills | Status: DC

## 2018-04-02 MED ORDER — PREDNISONE 50 MG PO TABS: 50.0000 mg | ORAL_TABLET | Freq: Every day | ORAL | 0 refills | Status: DC

## 2018-04-02 NOTE — Discharge Instructions (Signed)
Start prednisone as directed. Robaxin as needed, this can make you drowsy, so do not take if you are going to drive, operate heavy machinery, or make important decisions. Ice/heat compresses as needed. This can take up to 3-4 weeks to completely resolve, but you should be feeling better each week. Follow up here or with PCP if symptoms worsen, changes for reevaluation. If experience numbness/tingling of the inner thighs, loss of bladder or bowel control, go to the emergency department for evaluation.   Rapid strep negative.

## 2018-04-02 NOTE — ED Provider Notes (Signed)
[place]    CSN: [id number] Arrival date & time: 04/02/18  1632     History   Chief Complaint Chief Complaint  Patient presents with  . Back Pain    430  . Fatigue    HPI Ashley Mendez is a 37 y.o. female.   37 year old female comes in for 3 day history of right sided back pain. States was lifting weights when this happened. Pain is intermittent, and is most present when standing for greater than 5 mins, or laying down. Recently started having radiation of pain, that seems to go down the right leg.  Denies numbness, tingling, saddle anesthesia, loss of bladder or bowel control.  Has been taking ibuprofen with mild relief.  Patient has had some fatigue, states could be due to having back pain and trouble sleeping at night. However, would like strep test as both her children have strep at home. States had sore throat 1 week ago, and now without any URI symptoms, but would like to be checked.      Past Medical History:  Diagnosis Date  . HSV (herpes simplex virus) anogenital infection   . Hx of varicella   . Medical history non-contributory   . PAC (premature atrial contraction) 01/26/2017  . Palpitation 12/10/2016  . PVC (premature ventricular contraction) 01/26/2017    Patient Active Problem List   Diagnosis Date Noted  . PAC (premature atrial contraction) 01/26/2017  . PVC (premature ventricular contraction) 01/26/2017  . Palpitation 12/10/2016  . Status post repeat low transverse cesarean section 11/29/2015  . Indication for care in labor or delivery 12/05/2014  . Chronic UTI 01/22/2013    Past Surgical History:  Procedure Laterality Date  . CESAREAN SECTION N/A 12/06/2014   Procedure: CESAREAN SECTION;  Surgeon: Jerelyn Charles, MD;  Location: [facility];  Service: Obstetrics;  Laterality: N/A;  . CESAREAN SECTION N/A 11/29/2015   Procedure: REPEAT CESAREAN SECTION, TWO DESSEL CORDS;  Surgeon: Jerelyn Charles, MD;  Location: [facility];  Service:  Obstetrics;  Laterality: N/A;    OB History    Gravida  2   Para  2   Term  2   Preterm      AB      Living  2     SAB      TAB      Ectopic      Multiple  0   Live Births  2            Home Medications    Prior to Admission medications   Medication Sig Start Date End Date Taking? Authorizing Provider  methocarbamol (ROBAXIN) 500 MG tablet Take 1 tablet (500 mg total) by mouth 2 (two) times daily. 04/02/18   Tasia Catchings,  V, PA-C  predniSONE (DELTASONE) 50 MG tablet Take 1 tablet (50 mg total) by mouth daily. 04/02/18   Ok Edwards, PA-C    Family History Family History  Problem Relation Age of Onset  . Atrial fibrillation Father   . Von Willebrand disease Brother   . Stroke Maternal Grandfather   . Heart attack Paternal Grandfather     Social History Social History   Tobacco Use  . Smoking status: Never Smoker  . Smokeless tobacco: Never Used  Substance Use Topics  . Alcohol use: Yes  . Drug use: No     Allergies   Patient has no known allergies.   Review of Systems Review of Systems  Reason unable to perform ROS: See HPI  as above.     Physical Exam Triage Vital Signs ED Triage Vitals  Enc Vitals Group     BP 04/02/18 1651 110/66     Pulse Rate 04/02/18 1651 74     Resp 04/02/18 1651 18     Temp 04/02/18 1651 98.5 F (36.9 C)     Temp src --      SpO2 04/02/18 1651 98 %     Weight --      Height --      Head Circumference --      Peak Flow --      Pain Score 04/02/18 1650 0     Pain Loc --      Pain Edu? --      Excl. in [place]? --    No data found.  Updated Vital Signs BP 110/66   Pulse 74   Temp 98.5 F (36.9 C)   Resp 18   LMP 03/19/2018   SpO2 98%   Physical Exam  Constitutional: She is oriented to person, place, and time. She appears well-developed and well-nourished. No distress.  HENT:  Head: Normocephalic and atraumatic.  Mouth/Throat: Uvula is midline, oropharynx is clear and moist and mucous membranes are  normal. No tonsillar exudate.  Eyes: Pupils are equal, round, and reactive to light. Conjunctivae are normal.  Cardiovascular: Normal rate, regular rhythm and normal heart sounds. Exam reveals no gallop and no friction rub.  No murmur heard. Pulmonary/Chest: Effort normal and breath sounds normal. No accessory muscle usage or stridor. No respiratory distress. She has no decreased breath sounds. She has no wheezes. She has no rhonchi. She has no rales.  Musculoskeletal:  No tenderness on palpation of the spinous processes. Tenderness to palpation of right lumbar region. Full range of motion of back and hips. Strength normal and equal bilaterally. Sensation intact and equal bilaterally. Negative straight leg raise.   Neurological: She is alert and oriented to person, place, and time.  Skin: Skin is warm and dry. She is not diaphoretic.     UC Treatments / Results  Labs (all labs ordered are listed, but only abnormal results are displayed) Labs Reviewed  CULTURE, GROUP A STREP [facility])  POCT RAPID STREP A    EKG None  Radiology No results found.  Procedures Procedures (including critical care time)  Medications Ordered in UC Medications - No data to display  Initial Impression / Assessment and Plan / UC Course  I have reviewed the triage vital signs and the nursing notes.  Pertinent labs & imaging results that were available during my care of the patient were reviewed by me and considered in my medical decision making (see chart for details).    Start prednisone as directed. Muscle relaxant as needed. Ice/heat compresses. Discussed with patient strain can take up to 3-4 weeks to resolve, but should be getting better each week. Return precautions given.   Final Clinical Impressions(s) / UC Diagnoses   Final diagnoses:  Acute right-sided low back pain without sciatica    ED Prescriptions    Medication Sig Dispense Auth. Provider   predniSONE (DELTASONE) 50 MG tablet Take 1  tablet (50 mg total) by mouth daily. 5 tablet ,  V, PA-C   methocarbamol (ROBAXIN) 500 MG tablet Take 1 tablet (500 mg total) by mouth 2 (two) times daily. 20 tablet Tobin Chad, [place] 04/02/18 4268

## 2018-04-02 NOTE — ED Triage Notes (Signed)
Pt presents with complaints of throwing her back out and having continuous back pain x 3 days. Also states that her children have strep throat, patient complains of fatigue.

## 2018-04-05 LAB — CULTURE, GROUP A STREP (THRC)

## 2018-05-03 DIAGNOSIS — M545 Low back pain: Secondary | ICD-10-CM | POA: Diagnosis not present

## 2018-05-03 DIAGNOSIS — Z131 Encounter for screening for diabetes mellitus: Secondary | ICD-10-CM | POA: Diagnosis not present

## 2018-05-03 DIAGNOSIS — Z13220 Encounter for screening for lipoid disorders: Secondary | ICD-10-CM | POA: Diagnosis not present

## 2018-05-03 DIAGNOSIS — Z0000 Encounter for general adult medical examination without abnormal findings: Secondary | ICD-10-CM | POA: Diagnosis not present

## 2018-05-06 DIAGNOSIS — Z01419 Encounter for gynecological examination (general) (routine) without abnormal findings: Secondary | ICD-10-CM | POA: Diagnosis not present

## 2018-05-06 DIAGNOSIS — Z124 Encounter for screening for malignant neoplasm of cervix: Secondary | ICD-10-CM | POA: Diagnosis not present

## 2018-05-06 LAB — RESULTS CONSOLE HPV: CHL HPV: NEGATIVE

## 2018-05-06 LAB — HM PAP SMEAR

## 2018-05-25 ENCOUNTER — Other Ambulatory Visit: Payer: Self-pay

## 2018-05-25 ENCOUNTER — Ambulatory Visit: Payer: 59 | Attending: Physician Assistant | Admitting: Physical Therapy

## 2018-05-25 ENCOUNTER — Encounter: Payer: Self-pay | Admitting: Physical Therapy

## 2018-05-25 DIAGNOSIS — M545 Low back pain, unspecified: Secondary | ICD-10-CM

## 2018-05-25 NOTE — Therapy (Signed)
[place], [place], [address] Phone: [phone]   Fax:  [phone]  Physical Therapy Evaluation  Patient Details  Name: Ashley Mendez MRN: [id number] Date of Birth: 04/09/1981 Referring Provider (PT): Mat Carne, [place]   Encounter Date: 05/25/2018  PT End of Session - 05/25/18 2111    Visit Number  1    Number of Visits  8    Date for PT Re-Evaluation  06/29/18    Authorization Type  [facility] [employer] Save plan    PT Start Time  1615    PT Stop Time  1715    PT Time Calculation (min)  60 min    Activity Tolerance  Patient tolerated treatment well    Behavior During Therapy  [facility] for tasks assessed/performed       Past Medical History:  Diagnosis Date  . HSV (herpes simplex virus) anogenital infection   . Hx of varicella   . Medical history non-contributory   . PAC (premature atrial contraction) 01/26/2017  . Palpitation 12/10/2016  . PVC (premature ventricular contraction) 01/26/2017    Past Surgical History:  Procedure Laterality Date  . CESAREAN SECTION N/A 12/06/2014   Procedure: CESAREAN SECTION;  Surgeon: Jerelyn Charles, MD;  Location: [facility];  Service: Obstetrics;  Laterality: N/A;  . CESAREAN SECTION N/A 11/29/2015   Procedure: REPEAT CESAREAN SECTION, TWO DESSEL CORDS;  Surgeon: Jerelyn Charles, MD;  Location: [facility];  Service: Obstetrics;  Laterality: N/A;    There were no vitals filed for this visit.   Subjective Assessment - 05/25/18 1620    Subjective  Pt. is a 38 y/o female with c/o acute onset LBP associated with performing deadlift exercise this past November. She reports felt like something "slipped" in right lower back. Initial pain localized to right lumbar region but 1-2 weeks later began to note intermittent radiating pain into right lateral hip and anterior thigh. Previous episodes of similar symptoms in the past which had resolved.    Limitations  Lifting;Standing;Walking     Diagnostic tests  X-rays    Patient Stated Goals  Resume/continue exercising    Currently in Pain?  Yes    Pain Score  3     Pain Location  Back    Pain Orientation  Right;Lower    Pain Descriptors / Indicators  Dull    Pain Type  Acute pain    Pain Radiating Towards  right lateral hip and anterior thigh    Pain Onset  More than a month ago    Pain Frequency  Intermittent    Aggravating Factors   bending, standing and walking    Pain Relieving Factors  medication, avoidance above-noted activities    Effect of Pain on Daily Activities  Limits standing and walking tolerance, difficulty exercising         [facility] PT Assessment - 05/25/18 0001      Assessment   Medical Diagnosis  Acute right LBP without sciatica    Referring Provider (PT)  Mat Carne, PA-C    Onset Date/Surgical Date  03/27/18    Prior Therapy  none      Precautions   Precautions  None      Restrictions   Weight Bearing Restrictions  No      Balance Screen   Has the patient fallen in the past 6 months  No      [place] residence    Living Arrangements  Spouse/significant other;Children      Prior Function   Level of Independence  Independent      Cognition   Overall Cognitive Status  Within Functional Limits for tasks assessed      Sensation   Light Touch  --   grossly intact to bilat. LE L2-S2 dermatomes     ROM / Strength   AROM / PROM / Strength  AROM;Strength      AROM   Overall AROM Comments  Hip AROM/PROM grossly WFL bilat.    AROM Assessment Site  Hip;Lumbar    Lumbar Flexion  95    Lumbar Extension  20    Lumbar - Right Side Bend  25    Lumbar - Left Side Bend  32    Lumbar - Right Rotation  WFL    Lumbar - Left Rotation  [facility]      Strength   Overall Strength Comments  Right hip abduction and extension 4+/5 otherwise bilat. LE grossly 5/5      Palpation   SI assessment   No innominate rotation noted, thigh thrust and FABER (-), no pain to  palpation/with springing    Palpation comment  Tight/TTP right lumbar longissimus and iliocostalis L4-5 region, right QL, mild tenderness to deep palpation in gluteus medius and piriformis but primary tenderness in lumbar paraspinals and QL      Special Tests   Other special tests  SLR (-)                Objective measurements completed on examination: See above findings.      [facility] Adult PT Treatment/Exercise - 05/25/18 0001      Exercises   Exercises  Lumbar      Lumbar Exercises: Stretches   Other Lumbar Stretch Exercise  HEP instruction-see handout under pt. instructions       Trigger Point Dry Needling - 05/25/18 2108    Consent Given?  Yes    Education Handout Provided  Yes    Muscles Treated Upper Body  Longissimus;Quadratus Lumborum   also right iliocostalis at L4-5 needles left in situ 10 min          PT Education - 05/25/18 2109    Education Details  potential etiology symptoms, spine anatomy, dry needling, POC    Person(s) Educated  Patient    Methods  Explanation;Demonstration;Verbal cues;Handout    Comprehension  Verbalized understanding;Returned demonstration       PT Short Term Goals - 05/25/18 2119      PT SHORT TERM GOAL #1   Title  Independent with HEP    Baseline  no HEP    Time  2    Period  Weeks      PT SHORT TERM GOAL #2   Title  Centralize Rt. LE pain to lumbar region for improved tolerance standing/walking without leg pain    Baseline  radiates to right anterior thigh    Time  2    Period  Weeks      PT SHORT TERM GOAL #3   Title  Return demos body mechanics for lifting activities/exercises    Baseline  reviewed today    Time  2    Period  Weeks        PT Long Term Goals - 05/25/18 2119      PT LONG TERM GOAL #1   Title  Right hip strength 5/5 to improve tolerance for lifting activities for IADLs, exercising    Baseline  Hip abd and ext 4+/5    Time  5    Period  Weeks    Status  New    Target Date  06/29/18       PT LONG TERM GOAL #2   Title  Tolerate standing/walking for chores, community mobility, shopping with LBP decreased 75% or greater from current status    Time  5    Period  Weeks    Status  New    Target Date  06/29/18      PT LONG TERM GOAL #3   Title  Resume gym exercises/modified as needed without limitation due to LBP    Time  5    Period  Weeks    Status  New    Target Date  06/29/18             Plan - 05/25/18 2112    Clinical Impression Statement  Pt. presents with ongoing LBP s/p muscle strain-type mechanism of injury this past November. Primary pain consistent with muscular/myofascial etiology. Intermittent radiating pain into thigh could potentially be radicular-no clear centralization/peripheralization of RLE symptoms with trunk ROM at eval but given mechanism of injury and pain exacerbated with flexion plan trial extension bias ROM along with other soft tissue focused-tx. Pt. would benefit from PT to address current associated functional limitationsl    History and Personal Factors relevant to plan of care:  past episodes similar symptoms    Clinical Presentation  Stable    Clinical Decision Making  Low    Rehab Potential  Good    PT Frequency  --   1-2x/week   PT Duration  --   5 weeks   PT Treatment/Interventions  ADLs/Self Care Home Management;Cryotherapy;Ultrasound;Traction;Moist Heat;Electrical Stimulation;Functional mobility training;Manual techniques;Dry needling;Neuromuscular re-education;Therapeutic exercise;Therapeutic activities;Patient/family education;Spinal Manipulations;Taping    PT Next Visit Plan  Assess FOTO, check response extension bias ROM and progress as tolerated, further dry needling pending response to tx. at eval, hip stretches, lumbar and hip STM, modalities prn    PT Home Exercise Plan  prone press up, stretches: right piriformis, lateral hip and QL, hip bridge    Consulted and Agree with Plan of Care  Patient       Patient will  benefit from skilled therapeutic intervention in order to improve the following deficits and impairments:  Pain, Increased muscle spasms, Decreased range of motion, Decreased strength, Decreased activity tolerance, Difficulty walking  Visit Diagnosis: Acute right-sided low back pain without sciatica     Problem List Patient Active Problem List   Diagnosis Date Noted  . PAC (premature atrial contraction) 01/26/2017  . PVC (premature ventricular contraction) 01/26/2017  . Palpitation 12/10/2016  . Status post repeat low transverse cesarean section 11/29/2015  . Indication for care in labor or delivery 12/05/2014  . Chronic UTI 01/22/2013    [name], PT, DPT 05/25/18 9:27 PM  [facility] [address] [place], [place], [address] Phone: [phone]   Fax:  [phone]  Name: Timmia Cogburn MRN: [id number] Date of Birth: 02-21-1981

## 2018-05-25 NOTE — Patient Instructions (Addendum)

## 2018-05-29 ENCOUNTER — Encounter (HOSPITAL_COMMUNITY): Payer: Self-pay

## 2018-05-29 ENCOUNTER — Ambulatory Visit (HOSPITAL_COMMUNITY)
Admission: EM | Admit: 2018-05-29 | Discharge: 2018-05-29 | Disposition: A | Discharge status: Home / Self Care | Payer: 59 | Attending: Family Medicine | Admitting: Family Medicine

## 2018-05-29 ENCOUNTER — Other Ambulatory Visit: Payer: Self-pay

## 2018-05-29 DIAGNOSIS — R69 Illness, unspecified: Secondary | ICD-10-CM | POA: Insufficient documentation

## 2018-05-29 DIAGNOSIS — J111 Influenza due to unidentified influenza virus with other respiratory manifestations: Secondary | ICD-10-CM

## 2018-05-29 MED ORDER — OSELTAMIVIR PHOSPHATE 75 MG PO CAPS
75.0000 mg | ORAL_CAPSULE | Freq: Two times a day (BID) | ORAL | 0 refills | Status: DC
Start: 2018-05-29 — End: 2020-11-09

## 2018-05-29 NOTE — Discharge Instructions (Signed)
Get plenty of rest and push fluids Tamiflu prescribed.  Take as directed and to completion Use OTC medications like ibuprofen or tylenol as needed fever or pain Follow up with PCP if symptoms persist Return or go to ER if you have any new or worsening symptoms fever, chills, nausea, vomiting, chest pain, cough, shortness of breath, wheezing, abdominal pain, changes in bowel or bladder habits, etc..Marland Kitchen

## 2018-05-29 NOTE — ED Provider Notes (Signed)
[place]   [id number] 05/29/18 Arrival Time: 1628   CC: Flu like symptoms   SUBJECTIVE: History from: patient.  Ashley Mendez is a 38 y.o. female who presents with abrupt onset of nasal congestion, cough, fever, and sore throat x 1 day.  Admits to sick exposure to husband and daughter with the flu.  Has tried OTC medication without relief.  Denies aggravating factors.  Reports previous symptoms in the past. Complains of associated fatigue, chills, and body aches. Denies sinus pain, SOB, wheezing, chest pain, nausea, changes in bowel or bladder habits.    Received flu shot this year: yes.  ROS: As per HPI.  Past Medical History:  Diagnosis Date  . HSV (herpes simplex virus) anogenital infection   . Hx of varicella   . Medical history non-contributory   . PAC (premature atrial contraction) 01/26/2017  . Palpitation 12/10/2016  . PVC (premature ventricular contraction) 01/26/2017   Past Surgical History:  Procedure Laterality Date  . CESAREAN SECTION N/A 12/06/2014   Procedure: CESAREAN SECTION;  Surgeon: Jerelyn Charles, MD;  Location: [facility];  Service: Obstetrics;  Laterality: N/A;  . CESAREAN SECTION N/A 11/29/2015   Procedure: REPEAT CESAREAN SECTION, TWO DESSEL CORDS;  Surgeon: Jerelyn Charles, MD;  Location: [facility];  Service: Obstetrics;  Laterality: N/A;   No Known Allergies No current facility-administered medications on file prior to encounter.    No current outpatient medications on file prior to encounter.   Social History   Socioeconomic History  . Marital status: Married    Spouse name: Not on file  . Number of children: Not on file  . Years of education: Not on file  . Highest education level: Not on file  Occupational History  . Not on file  Social Needs  . Financial resource strain: Not on file  . Food insecurity:    Worry: Not on file    Inability: Not on file  . Transportation needs:    Medical: Not on file    Non-medical: Not on  file  Tobacco Use  . Smoking status: Never Smoker  . Smokeless tobacco: Never Used  Substance and Sexual Activity  . Alcohol use: Yes  . Drug use: No  . Sexual activity: Yes    Partners: Male  Lifestyle  . Physical activity:    Days per week: Not on file    Minutes per session: Not on file  . Stress: Not on file  Relationships  . Social connections:    Talks on phone: Not on file    Gets together: Not on file    Attends religious service: Not on file    Active member of club or organization: Not on file    Attends meetings of clubs or organizations: Not on file    Relationship status: Not on file  . Intimate partner violence:    Fear of current or ex partner: Not on file    Emotionally abused: Not on file    Physically abused: Not on file    Forced sexual activity: Not on file  Other Topics Concern  . Not on file  Social History Narrative  . Not on file   Family History  Problem Relation Age of Onset  . Atrial fibrillation Father   . Von Willebrand disease Brother   . Stroke Maternal Grandfather   . Heart attack Paternal Grandfather     OBJECTIVE:  Vitals:   05/29/18 1637 05/29/18 1639  BP:  108/62  Resp:  18  Temp:  (!) 100.5 F (38.1 C)  TempSrc:  Oral  SpO2:  100%  Weight: 120 lb (54.4 kg)      General appearance: alert; appears fatigued, but nontoxic; speaking in full sentences and tolerating own secretions HEENT: NCAT; Ears: EACs clear, TMs pearly gray; Eyes: PERRL.  EOM grossly intact. Nose: nares patent with mild clear rhinorrhea, Throat: oropharynx clear, tonsils non erythematous or enlarged, uvula midline  Neck: supple without LAD Lungs: unlabored respirations, symmetrical air entry; cough: absent; no respiratory distress; CTAB Heart: regular rate and rhythm.  Skin: warm and dry Psychological: alert and cooperative; normal mood and affect  ASSESSMENT & PLAN:  1. Influenza-like illness     Meds ordered this encounter  Medications  .  oseltamivir (TAMIFLU) 75 MG capsule    Sig: Take 1 capsule (75 mg total) by mouth every 12 (twelve) hours.    Dispense:  10 capsule    Refill:  0    Order Specific Question:   Supervising Provider    Answer:   Raylene Everts [[id number]]    Get plenty of rest and push fluids Tamiflu prescribed.  Take as directed and to completion Use OTC medications like ibuprofen or tylenol as needed fever or pain Follow up with PCP if symptoms persist Return or go to ER if you have any new or worsening symptoms fever, chills, nausea, vomiting, chest pain, cough, shortness of breath, wheezing, abdominal pain, changes in bowel or bladder habits, etc...  Reviewed expectations re: course of current medical issues. Questions answered. Outlined signs and symptoms indicating need for more acute intervention. Patient verbalized understanding. After Visit Summary given.         Lestine Box, PA-C 05/29/18 1720

## 2018-05-29 NOTE — ED Triage Notes (Signed)
Pt cc pt husband and daughter both have the flu. Congestion , cough and fever sore throat. started yesterday.

## 2018-06-08 ENCOUNTER — Ambulatory Visit: Payer: 59 | Admitting: Physical Therapy

## 2018-06-08 ENCOUNTER — Encounter: Payer: Self-pay | Admitting: Physical Therapy

## 2018-06-08 DIAGNOSIS — M545 Low back pain, unspecified: Secondary | ICD-10-CM

## 2018-06-08 NOTE — Therapy (Signed)
[place], [place], [address] Phone: [phone]   Fax:  [phone]  Physical Therapy Treatment  Patient Details  Name: Ashley Mendez MRN: [id number] Date of Birth: 1980-06-23 Referring Provider (PT): Mat Carne, [place]   Encounter Date: 06/08/2018  PT End of Session - 06/08/18 1731    Visit Number  2    Number of Visits  8    Date for PT Re-Evaluation  06/29/18    Authorization Type  [employer] Save plan    PT Start Time  3220    PT Stop Time  1629    PT Time Calculation (min)  43 min    Activity Tolerance  Patient tolerated treatment well    Behavior During Therapy  [facility] for tasks assessed/performed       Past Medical History:  Diagnosis Date  . HSV (herpes simplex virus) anogenital infection   . Hx of varicella   . Medical history non-contributory   . PAC (premature atrial contraction) 01/26/2017  . Palpitation 12/10/2016  . PVC (premature ventricular contraction) 01/26/2017    Past Surgical History:  Procedure Laterality Date  . CESAREAN SECTION N/A 12/06/2014   Procedure: CESAREAN SECTION;  Surgeon: Jerelyn Charles, MD;  Location: [facility];  Service: Obstetrics;  Laterality: N/A;  . CESAREAN SECTION N/A 11/29/2015   Procedure: REPEAT CESAREAN SECTION, TWO DESSEL CORDS;  Surgeon: Jerelyn Charles, MD;  Location: [place];  Service: Obstetrics;  Laterality: N/A;    There were no vitals filed for this visit.  Subjective Assessment - 06/08/18 1721    Subjective  Pt. had to cancel first follow up visit due to getting the flu (since recovered). She reports that she did have some improvement after tx. at eval. Primary pain is right QL and lower lumbar region. Radiating symptos resolved. Still having some soreness with trying to exercise.    Diagnostic tests  X-rays    Patient Stated Goals  Resume/continue exercising    Currently in Pain?  Yes    Pain Score  1     Pain Location  Back    Pain  Orientation  Right;Lower    Pain Descriptors / Indicators  Dull    Pain Type  Acute pain    Pain Radiating Towards  Right QL region    Pain Onset  More than a month ago    Pain Frequency  Intermittent    Aggravating Factors   bending, jumping, prolonged standing and walking    Pain Relieving Factors  medication, avoidance exacerbating activities    Effect of Pain on Daily Activities  Limits positional tolerance, ability for chores, exercising                       [facility] Adult PT Treatment/Exercise - 06/08/18 0001      Lumbar Exercises: Stretches   Press Ups  --   2x10, verbal/tactile cues to keep pelvis on mat   Piriformis Stretch  Right;3 reps;30 seconds    Other Lumbar Stretch Exercise  Right manual QL stretch in left sidelying 3x30 seconds      Lumbar Exercises: Supine   Pelvic Tilt  15 reps    Dead Bug  20 reps   LE only   Bridge with [employer]  20 reps      Lumbar Exercises: Prone   Straight Leg Raise  10 reps      Manual Therapy   Manual Therapy  Joint  mobilization;Soft tissue mobilization    Manual therapy comments  skilled palpation of trigger points    Joint Mobilization  Long axis distraction right hip grade I-III    Soft tissue mobilization  STM right lumbar paraspinals       Trigger Point Dry Needling - 06/08/18 1729    Consent Given?  Yes    Muscles Treated Upper Body  --   Right QL, right lumbar multifidi L4-5 region          PT Education - 06/08/18 1730    Education Details  form for HEP for prone press up, gym exercises    Person(s) Educated  Patient    Methods  Explanation;Demonstration;Tactile cues;Verbal cues    Comprehension  Verbalized understanding;Returned demonstration       PT Short Term Goals - 05/25/18 2119      PT SHORT TERM GOAL #1   Title  Independent with HEP    Baseline  no HEP    Time  2    Period  Weeks      PT SHORT TERM GOAL #2   Title  Centralize Rt. LE pain to lumbar region for improved tolerance  standing/walking without leg pain    Baseline  radiates to right anterior thigh    Time  2    Period  Weeks      PT SHORT TERM GOAL #3   Title  Return Marketing executive for lifting activities/exercises    Baseline  reviewed today    Time  2    Period  Weeks        PT Long Term Goals - 05/25/18 2119      PT LONG TERM GOAL #1   Title  Right hip strength 5/5 to improve tolerance for lifting activities for IADLs, exercising    Baseline  Hip abd and ext 4+/5    Time  5    Period  Weeks    Status  New    Target Date  06/29/18      PT LONG TERM GOAL #2   Title  Tolerate standing/walking for chores, community mobility, shopping with LBP decreased 75% or greater from current status    Time  5    Period  Weeks    Status  New    Target Date  06/29/18      PT LONG TERM GOAL #3   Title  Resume gym exercises/modified as needed without limitation due to LBP    Time  5    Period  Weeks    Status  New    Target Date  06/29/18            Plan - 06/08/18 1732    Clinical Impression Statement  Pt. improving from baseline with decreased pain, improved radicular symptoms and functional gains for activity and positional tolerance. Mild apprehension with dry needling and briefly had some soreness with supine lumbar stabilization exercises but otherwise session well-tolerated and pt. is progressing appropriately re: therapy goals.    Clinical Presentation  Stable    Clinical Decision Making  Low    Rehab Potential  Good    PT Frequency  --   1-2x/week   PT Duration  --   5 weeks   PT Treatment/Interventions  ADLs/Self Care Home Management;Cryotherapy;Ultrasound;Traction;Moist Heat;Electrical Stimulation;Functional mobility training;Manual techniques;Dry needling;Neuromuscular re-education;Therapeutic exercise;Therapeutic activities;Patient/family education;Spinal Manipulations;Taping    PT Next Visit Plan  Assess FOTO, continue extension bias ROM and progress as tolerated, further  dry needling  as needed, hip stretches, lumbar and hip STM, modalities prn    PT Home Exercise Plan  prone press up, stretches: right piriformis, lateral hip and QL, hip bridge    Consulted and Agree with Plan of Care  Patient       Patient will benefit from skilled therapeutic intervention in order to improve the following deficits and impairments:  Pain, Increased muscle spasms, Decreased range of motion, Decreased strength, Decreased activity tolerance, Difficulty walking  Visit Diagnosis: Acute right-sided low back pain without sciatica     Problem List Patient Active Problem List   Diagnosis Date Noted  . PAC (premature atrial contraction) 01/26/2017  . PVC (premature ventricular contraction) 01/26/2017  . Palpitation 12/10/2016  . Status post repeat low transverse cesarean section 11/29/2015  . Indication for care in labor or delivery 12/05/2014  . Chronic UTI 01/22/2013   [name], PT, DPT 06/08/18 5:37 PM  [facility] [address] [place], [place], [address] Phone: [phone]   Fax:  [phone]  Name: Gentri Guardado MRN: [id number] Date of Birth: [birth year]

## 2018-06-25 ENCOUNTER — Ambulatory Visit: Payer: 59 | Attending: Physician Assistant | Admitting: Physical Therapy

## 2018-06-25 ENCOUNTER — Encounter: Payer: Self-pay | Admitting: Physical Therapy

## 2018-06-25 DIAGNOSIS — M545 Low back pain, unspecified: Secondary | ICD-10-CM

## 2018-06-25 NOTE — Therapy (Signed)
[place], [place], [address] Phone: [phone]   Fax:  [phone]  Physical Therapy Treatment  Patient Details  Name: Ashley Mendez MRN: [id number] Date of Birth: 02/23/1981 Referring Provider (PT): Mat Carne, [place]   Encounter Date: 06/25/2018  PT End of Session - 06/25/18 1043    Visit Number  3    Number of Visits  8    Date for PT Re-Evaluation  06/29/18    Authorization Type  [employer] Save plan    PT Start Time  0805    PT Stop Time  0844    PT Time Calculation (min)  39 min    Activity Tolerance  Patient tolerated treatment well    Behavior During Therapy  [facility] for tasks assessed/performed       Past Medical History:  Diagnosis Date  . HSV (herpes simplex virus) anogenital infection   . Hx of varicella   . Medical history non-contributory   . PAC (premature atrial contraction) 01/26/2017  . Palpitation 12/10/2016  . PVC (premature ventricular contraction) 01/26/2017    Past Surgical History:  Procedure Laterality Date  . CESAREAN SECTION N/A 12/06/2014   Procedure: CESAREAN SECTION;  Surgeon: Jerelyn Charles, MD;  Location: [facility];  Service: Obstetrics;  Laterality: N/A;  . CESAREAN SECTION N/A 11/29/2015   Procedure: REPEAT CESAREAN SECTION, TWO DESSEL CORDS;  Surgeon: Jerelyn Charles, MD;  Location: [place];  Service: Obstetrics;  Laterality: N/A;    There were no vitals filed for this visit.  Subjective Assessment - 06/25/18 1033    Subjective  Pt. reports symptoms have been worse after last session-mostly pain in right lateral lower lumbar region with soreness into right QL and hip flexors. Pt. reports feels "off" with alignment in back/posture.    Limitations  Lifting;Standing;Walking    Diagnostic tests  X-rays    Patient Stated Goals  Resume/continue exercising    Currently in Pain?  Yes    Pain Score  4     Pain Location  Back    Pain Orientation  Right;Lower    Pain  Descriptors / Indicators  Dull    Pain Type  Acute pain    Pain Radiating Towards  Right QL and hip flexor region    Pain Onset  More than a month ago    Pain Frequency  Intermittent    Aggravating Factors   standing, activity    Pain Relieving Factors  sitting    Effect of Pain on Daily Activities  Limits posiitonal tolerance for standing and ability for exercising         [facility] PT Assessment - 06/25/18 0001      Observation/Other Assessments   Observations  Mild right anterior innominate rotation noted                   [facility] Adult PT Treatment/Exercise - 06/25/18 0001      Lumbar Exercises: Stretches   Lower Trunk Rotation  --   manual left LTR at 90 deg hip flexion x 15 reps   Hip Flexor Stretch  Right;3 reps;30 seconds    Hip Flexor Stretch Limitations  supine with RLE off edge of mat    Other Lumbar Stretch Exercise  Right manual QL stretch 3x30 sec from left sidelying      Lumbar Exercises: Sidelying   Other Sidelying Lumbar Exercises  Left sidelying with right trunk rotation/"open book" x 10 reps  Manual Therapy   Manual Therapy  Joint mobilization;Soft tissue mobilization;Muscle Energy Technique    Joint Mobilization  Long axis distraction right hip grade I-III    Soft tissue mobilization  STM right lumbar region, QL    Muscle Energy Technique  "shotgun" and right hamstring + left hip flexor isometrics for correction right anterior innominate rotation             PT Education - 06/25/18 1042    Education Details  potential symptom etiology, muscular anatomy with connection hip flexors to lumbar region, anatomy innominate rotation, HEP-added right hpi flexor stretch    Person(s) Educated  Patient    Methods  Explanation;Demonstration    Comprehension  Verbalized understanding;Returned demonstration       PT Short Term Goals - 06/25/18 1049      PT SHORT TERM GOAL #1   Title  Independent with HEP    Baseline  updated today    Time  2     Period  Weeks      PT SHORT TERM GOAL #2   Title  Centralize Rt. LE pain to lumbar region for improved tolerance standing/walking without leg pain    Baseline  met    Time  2    Period  Weeks    Status  Achieved      PT SHORT TERM GOAL #3   Title  Return demos body mechanics for lifting activities/exercises    Baseline  met    Time  2    Period  Weeks    Status  Achieved        PT Long Term Goals - 06/25/18 1048      PT LONG TERM GOAL #1   Title  Right hip strength 5/5 to improve tolerance for lifting activities for IADLs, exercising    Baseline  MMTs not tested today    Time  5    Period  Weeks    Status  On-going      PT LONG TERM GOAL #2   Title  Tolerate standing/walking for chores, community mobility, shopping with LBP decreased 75% or greater from current status    Baseline  not met    Time  5    Period  Weeks    Status  On-going      PT LONG TERM GOAL #3   Title  Resume gym exercises/modified as needed without limitation due to LBP    Baseline  still limited by LBP    Time  5    Period  Weeks    Status  On-going            Plan - 06/25/18 1043    Clinical Impression Statement  Some setback in progress with increased soreness and pain for unclear reasons as expect timeframe since last session outside of typical timeframe for post-tx. soreness for exercises and dry needling as well as manual therapy. Held needling today with more manual focus to address soft tissue restriction. Did note mild right anterior innominate rotation which able to correct with METs. Alignement issue appeared more associated with innominate rotation than mechanical derangement but swill continue to monitor. Today only 3rd tx. session so difficulty to assess pattern to explain exact cause symptom exacerbation.    Rehab Potential  Good    PT Frequency  --   1-2x/week   PT Duration  --   5 weeks   PT Treatment/Interventions  ADLs/Self Care Home  Management;Cryotherapy;Ultrasound;Traction;Moist Heat;Electrical Stimulation;Functional mobility training;Manual  techniques;Dry needling;Neuromuscular re-education;Therapeutic exercise;Therapeutic activities;Patient/family education;Spinal Manipulations;Taping    PT Next Visit Plan  Progress note/recert next visit, check for innominate rotation and continue METs prn, check lateral shift, continue extension bias ROM and progress as tolerated, further dry needling as needed, hip stretches, lumbar and hip STM, modalities prn    PT Home Exercise Plan  prone press up, stretches: right piriformis, lateral hip and QL, hip bridge, right hip flexor stretch    Consulted and Agree with Plan of Care  Patient       Patient will benefit from skilled therapeutic intervention in order to improve the following deficits and impairments:  Pain, Increased muscle spasms, Decreased range of motion, Decreased strength, Decreased activity tolerance, Difficulty walking  Visit Diagnosis: Acute right-sided low back pain without sciatica     Problem List Patient Active Problem List   Diagnosis Date Noted  . PAC (premature atrial contraction) 01/26/2017  . PVC (premature ventricular contraction) 01/26/2017  . Palpitation 12/10/2016  . Status post repeat low transverse cesarean section 11/29/2015  . Indication for care in labor or delivery 12/05/2014  . Chronic UTI 01/22/2013    [name], PT, DPT 06/25/18 10:50 AM  [facility] [address] [place], [place], [address] Phone: [phone]   Fax:  [phone]  Name: Ashley Mendez MRN: [id number] Date of Birth: 09-20-1980

## 2018-07-09 ENCOUNTER — Encounter: Payer: 59 | Admitting: Physical Therapy

## 2018-07-19 ENCOUNTER — Telehealth: Payer: Self-pay | Admitting: Physical Therapy

## 2018-07-19 NOTE — Telephone Encounter (Signed)
Called patient to check on status to see if wishing to attend further therapy or ready for discharge. Left voicemail.

## 2018-07-21 ENCOUNTER — Telehealth: Payer: Self-pay | Admitting: Physical Therapy

## 2018-07-21 NOTE — Therapy (Signed)
[place], [place], [address] Phone: [phone]   Fax:  [phone]  Physical Therapy Treatment/Discharge  Patient Details  Name: Ashley Mendez MRN: [id number] Date of Birth: 04-Jun-1980 Referring Provider (PT): Mat Carne, PA-C   Encounter Date: 06/25/2018    Past Medical History:  Diagnosis Date  . HSV (herpes simplex virus) anogenital infection   . Hx of varicella   . Medical history non-contributory   . PAC (premature atrial contraction) 01/26/2017  . Palpitation 12/10/2016  . PVC (premature ventricular contraction) 01/26/2017    Past Surgical History:  Procedure Laterality Date  . CESAREAN SECTION N/A 12/06/2014   Procedure: CESAREAN SECTION;  Surgeon: Jerelyn Charles, MD;  Location: [facility];  Service: Obstetrics;  Laterality: N/A;  . CESAREAN SECTION N/A 11/29/2015   Procedure: REPEAT CESAREAN SECTION, TWO DESSEL CORDS;  Surgeon: Jerelyn Charles, MD;  Location: [facility];  Service: Obstetrics;  Laterality: N/A;    There were no vitals filed for this visit.                              PT Short Term Goals - 06/25/18 1049      PT SHORT TERM GOAL #1   Title  Independent with HEP    Baseline  updated today    Time  2    Period  Weeks      PT SHORT TERM GOAL #2   Title  Centralize Rt. LE pain to lumbar region for improved tolerance standing/walking without leg pain    Baseline  met    Time  2    Period  Weeks    Status  Achieved      PT SHORT TERM GOAL #3   Title  Return demos body mechanics for lifting activities/exercises    Baseline  met    Time  2    Period  Weeks    Status  Achieved        PT Long Term Goals - 06/25/18 1048      PT LONG TERM GOAL #1   Title  Right hip strength 5/5 to improve tolerance for lifting activities for IADLs, exercising    Baseline  MMTs not tested today    Time  5    Period  Weeks    Status  On-going      PT LONG  TERM GOAL #2   Title  Tolerate standing/walking for chores, community mobility, shopping with LBP decreased 75% or greater from current status    Baseline  not met    Time  5    Period  Weeks    Status  On-going      PT LONG TERM GOAL #3   Title  Resume gym exercises/modified as needed without limitation due to LBP    Baseline  still limited by LBP    Time  5    Period  Weeks    Status  On-going              Patient will benefit from skilled therapeutic intervention in order to improve the following deficits and impairments:  Pain, Increased muscle spasms, Decreased range of motion, Decreased strength, Decreased activity tolerance, Difficulty walking  Visit Diagnosis: Acute right-sided low back pain without sciatica     Problem List Patient Active Problem List   Diagnosis Date Noted  . PAC (premature atrial contraction) 01/26/2017  . PVC (premature ventricular  contraction) 01/26/2017  . Palpitation 12/10/2016  . Status post repeat low transverse cesarean section 11/29/2015  . Indication for care in labor or delivery 12/05/2014  . Chronic UTI 01/22/2013       PHYSICAL THERAPY DISCHARGE SUMMARY  Visits from Start of Care: 3  Current functional level related to goals / functional outcomes: Patient cancelled last scheduled visit due to insurance/financial reasons. Called to check on status and she reports still having some intermittent symptoms but overall doing well/OK to d/c.    Remaining deficits: NA   Education / Equipment: NA  Plan: Patient agrees to discharge.  Patient goals were partially met. Patient is being discharged due to the patient's request.  ?????         [name], PT, DPT 07/21/18 9:45 AM     [facility] [address] [place], [place], [address] Phone: [phone]   Fax:  [phone]  Name: Brunette Lavalle MRN: [id number] Date of Birth: 1980/07/25

## 2018-07-21 NOTE — Telephone Encounter (Signed)
Patient returned call to check on status and reports OK to d/c therapy for now, will seek new referral if needed to return in the future.

## 2019-01-04 DIAGNOSIS — F411 Generalized anxiety disorder: Secondary | ICD-10-CM | POA: Diagnosis not present

## 2019-01-13 DIAGNOSIS — Z23 Encounter for immunization: Secondary | ICD-10-CM | POA: Diagnosis not present

## 2019-01-13 DIAGNOSIS — F419 Anxiety disorder, unspecified: Secondary | ICD-10-CM | POA: Diagnosis not present

## 2019-01-13 DIAGNOSIS — F411 Generalized anxiety disorder: Secondary | ICD-10-CM | POA: Diagnosis not present

## 2019-02-23 DIAGNOSIS — F411 Generalized anxiety disorder: Secondary | ICD-10-CM | POA: Diagnosis not present

## 2019-09-16 DIAGNOSIS — Z01419 Encounter for gynecological examination (general) (routine) without abnormal findings: Secondary | ICD-10-CM | POA: Diagnosis not present

## 2019-09-16 DIAGNOSIS — Z1231 Encounter for screening mammogram for malignant neoplasm of breast: Secondary | ICD-10-CM | POA: Diagnosis not present

## 2019-09-19 ENCOUNTER — Ambulatory Visit: Payer: 59 | Attending: Internal Medicine

## 2019-09-19 DIAGNOSIS — Z20822 Contact with and (suspected) exposure to covid-19: Secondary | ICD-10-CM | POA: Diagnosis not present

## 2019-09-20 LAB — SARS-COV-2, NAA 2 DAY TAT

## 2019-09-20 LAB — NOVEL CORONAVIRUS, NAA: SARS-CoV-2, NAA: NOT DETECTED

## 2020-01-06 DIAGNOSIS — H524 Presbyopia: Secondary | ICD-10-CM | POA: Diagnosis not present

## 2020-01-06 DIAGNOSIS — H5203 Hypermetropia, bilateral: Secondary | ICD-10-CM | POA: Diagnosis not present

## 2020-01-06 DIAGNOSIS — H52203 Unspecified astigmatism, bilateral: Secondary | ICD-10-CM | POA: Diagnosis not present

## 2020-04-02 ENCOUNTER — Ambulatory Visit: Payer: 59 | Admitting: Family Medicine

## 2020-04-04 ENCOUNTER — Ambulatory Visit: Payer: 59 | Admitting: Family Medicine

## 2020-06-15 ENCOUNTER — Other Ambulatory Visit: Payer: Self-pay

## 2020-06-15 ENCOUNTER — Encounter: Payer: Self-pay | Admitting: Family Medicine

## 2020-06-15 ENCOUNTER — Ambulatory Visit (INDEPENDENT_AMBULATORY_CARE_PROVIDER_SITE_OTHER): Payer: 59 | Admitting: Family Medicine

## 2020-06-15 VITALS — BP 90/60 | HR 72 | Temp 98.2°F | Ht 62.0 in | Wt 120.0 lb

## 2020-06-15 DIAGNOSIS — Z1159 Encounter for screening for other viral diseases: Secondary | ICD-10-CM | POA: Diagnosis not present

## 2020-06-15 DIAGNOSIS — Z0000 Encounter for general adult medical examination without abnormal findings: Secondary | ICD-10-CM | POA: Diagnosis not present

## 2020-06-15 DIAGNOSIS — F411 Generalized anxiety disorder: Secondary | ICD-10-CM | POA: Insufficient documentation

## 2020-06-15 LAB — COMPREHENSIVE METABOLIC PANEL
ALT: 16 U/L (ref 0–35)
AST: 19 U/L (ref 0–37)
Albumin: 4.6 g/dL (ref 3.5–5.2)
Alkaline Phosphatase: 33 U/L — ABNORMAL LOW (ref 39–117)
BUN: 10 mg/dL (ref 6–23)
CO2: 27 mEq/L (ref 19–32)
Calcium: 9.7 mg/dL (ref 8.4–10.5)
Chloride: 103 mEq/L (ref 96–112)
Creatinine, Ser: 0.64 mg/dL (ref 0.40–1.20)
GFR: 110.03 mL/min (ref >60.00)
Glucose, Bld: 91 mg/dL (ref 70–99)
Potassium: 4.2 mEq/L (ref 3.5–5.1)
Sodium: 138 mEq/L (ref 135–145)
Total Bilirubin: 1.2 mg/dL (ref 0.2–1.2)
Total Protein: 7.5 g/dL (ref 6.0–8.3)

## 2020-06-15 LAB — CBC WITH DIFFERENTIAL/PLATELET
Basophils Absolute: 0 10*3/uL (ref 0.0–0.1)
Basophils Relative: 0.3 % (ref 0.0–3.0)
Eosinophils Absolute: 0 10*3/uL (ref 0.0–0.7)
Eosinophils Relative: 0.5 % (ref 0.0–5.0)
HCT: 40.3 % (ref 36.0–46.0)
Hemoglobin: 13.7 g/dL (ref 12.0–15.0)
Lymphocytes Relative: 40.6 % (ref 12.0–46.0)
Lymphs Abs: 2.1 10*3/uL (ref 0.7–4.0)
MCHC: 34 g/dL (ref 30.0–36.0)
MCV: 93.3 fl (ref 78.0–100.0)
Monocytes Absolute: 0.3 10*3/uL (ref 0.1–1.0)
Monocytes Relative: 6.7 % (ref 3.0–12.0)
Neutro Abs: 2.6 10*3/uL (ref 1.4–7.7)
Neutrophils Relative %: 51.9 % (ref 43.0–77.0)
Platelets: 240 10*3/uL (ref 150.0–400.0)
RBC: 4.31 Mil/uL (ref 3.87–5.11)
RDW: 12.8 % (ref 11.5–15.5)
WBC: 5.1 10*3/uL (ref 4.0–10.5)

## 2020-06-15 LAB — LIPID PANEL
Cholesterol: 132 mg/dL (ref 0–200)
HDL: 61.6 mg/dL (ref >39.00)
LDL Cholesterol: 47 mg/dL (ref 0–99)
NonHDL: 70.09
Total CHOL/HDL Ratio: 2
Triglycerides: 113 mg/dL (ref 0.0–149.0)
VLDL: 22.6 mg/dL (ref 0.0–40.0)

## 2020-06-15 LAB — TSH: TSH: 4.87 u[IU]/mL — ABNORMAL HIGH (ref 0.35–4.50)

## 2020-06-15 MED ORDER — BUSPIRONE HCL 5 MG PO TABS: ORAL_TABLET | ORAL | 1 refills | Status: DC

## 2020-06-15 NOTE — Patient Instructions (Signed)
Preventive Care 84-41 Years Old, Female Preventive care refers to lifestyle choices and visits with your health care provider that can promote health and wellness. This includes:  A yearly physical exam. This is also called an annual wellness visit.  Regular dental and eye exams.  Immunizations.  Screening for certain conditions.  Healthy lifestyle choices, such as: ? Eating a healthy diet. ? Getting regular exercise. ? Not using drugs or products that contain nicotine and tobacco. ? Limiting alcohol use. What can I expect for my preventive care visit? Physical exam Your health care provider will check your:  Height and weight. These may be used to calculate your BMI (body mass index). BMI is a measurement that tells if you are at a healthy weight.  Heart rate and blood pressure.  Body temperature.  Skin for abnormal spots. Counseling Your health care provider may ask you questions about your:  Past medical problems.  Family's medical history.  Alcohol, tobacco, and drug use.  Emotional well-being.  Home life and relationship well-being.  Sexual activity.  Diet, exercise, and sleep habits.  Work and work Statistician.  Access to firearms.  Method of birth control.  Menstrual cycle.  Pregnancy history. What immunizations do I need? Vaccines are usually given at various ages, according to a schedule. Your health care provider will recommend vaccines for you based on your age, medical history, and lifestyle or other factors, such as travel or where you work.   What tests do I need? Blood tests  Lipid and cholesterol levels. These may be checked every 5 years, or more often if you are over 3 years old.  Hepatitis C test.  Hepatitis B test. Screening  Lung cancer screening. You may have this screening every year starting at age 73 if you have a 30-pack-year history of smoking and currently smoke or have quit within the past 15 years.  Colorectal cancer  screening. ? All adults should have this screening starting at age 52 and continuing until age 17. ? Your health care provider may recommend screening at age 49 if you are at increased risk. ? You will have tests every 1-10 years, depending on your results and the type of screening test.  Diabetes screening. ? This is done by checking your blood sugar (glucose) after you have not eaten for a while (fasting). ? You may have this done every 1-3 years.  Mammogram. ? This may be done every 1-2 years. ? Talk with your health care provider about when you should start having regular mammograms. This may depend on whether you have a family history of breast cancer.  BRCA-related cancer screening. This may be done if you have a family history of breast, ovarian, tubal, or peritoneal cancers.  Pelvic exam and Pap test. ? This may be done every 3 years starting at age 10. ? Starting at age 11, this may be done every 5 years if you have a Pap test in combination with an HPV test. Other tests  STD (sexually transmitted disease) testing, if you are at risk.  Bone density scan. This is done to screen for osteoporosis. You may have this scan if you are at high risk for osteoporosis. Talk with your health care provider about your test results, treatment options, and if necessary, the need for more tests. Follow these instructions at home: Eating and drinking  Eat a diet that includes fresh fruits and vegetables, whole grains, lean protein, and low-fat dairy products.  Take vitamin and mineral supplements  as recommended by your health care provider.  Do not drink alcohol if: ? Your health care provider tells you not to drink. ? You are pregnant, may be pregnant, or are planning to become pregnant.  If you drink alcohol: ? Limit how much you have to 0-1 drink a day. ? Be aware of how much alcohol is in your drink. In the [place] one drink equals one 12 oz bottle of beer (355 mL), one 5 oz glass of  wine (148 mL), or one 1 oz glass of hard liquor (44 mL).   Lifestyle  Take daily care of your teeth and gums. Brush your teeth every morning and night with fluoride toothpaste. Floss one time each day.  Stay active. Exercise for at least 30 minutes 5 or more days each week.  Do not use any products that contain nicotine or tobacco, such as cigarettes, e-cigarettes, and chewing tobacco. If you need help quitting, ask your health care provider.  Do not use drugs.  If you are sexually active, practice safe sex. Use a condom or other form of protection to prevent STIs (sexually transmitted infections).  If you do not wish to become pregnant, use a form of birth control. If you plan to become pregnant, see your health care provider for a prepregnancy visit.  If told by your health care provider, take low-dose aspirin daily starting at age 50.  Find healthy ways to cope with stress, such as: ? Meditation, yoga, or listening to music. ? Journaling. ? Talking to a trusted person. ? Spending time with friends and family. Safety  Always wear your seat belt while driving or riding in a vehicle.  Do not drive: ? If you have been drinking alcohol. Do not ride with someone who has been drinking. ? When you are tired or distracted. ? While texting.  Wear a helmet and other protective equipment during sports activities.  If you have firearms in your house, make sure you follow all gun safety procedures. What's next?  Visit your health care provider once a year for an annual wellness visit.  Ask your health care provider how often you should have your eyes and teeth checked.  Stay up to date on all vaccines. This information is not intended to replace advice given to you by your health care provider. Make sure you discuss any questions you have with your health care provider. Document Revised: 01/31/2020 Document Reviewed: 01/07/2018 Elsevier Patient Education  2021 Elsevier Inc.  

## 2020-06-15 NOTE — Progress Notes (Signed)
Patient: Ashley Mendez MRN: [id number] DOB: [birth year] PCP: Orma Flaming, MD     Subjective:  Chief Complaint  Patient presents with  . Annual Exam    HPI: The patient is a 40 y.o. female who presents today for annual exam. She denies any changes to past medical history. There have been no recent hospitalizations. They are following a well balanced diet and exercise plan. She does cardio and weightlifting boot camps 30 min daily. Weight has been stable. No complaints today.  No colon cancer or breast cancer in first degree relative, but has paternal grandfather with colon cancer that killed him in his 1s.    GAD History of anxiety and she feels like she has had this her whole life. She does have buspar that she takes very sparingly. She does exercise (peloton) with her bike. She is very fit. She has done counseling in the past, but not currently in it right now. She feels well controlled with her anxiety.   Immunization History  Administered Date(s) Administered  . Influenza-Unspecified 02/18/2020   Colonoscopy: routine screening  Mammogram: 06/2019 Pap smear: UTD. Goes to GYN.    Review of Systems  Constitutional: Negative for chills, fatigue and fever.  HENT: Negative for dental problem, ear pain, hearing loss and trouble swallowing.   Eyes: Negative for visual disturbance.  Respiratory: Negative for cough, chest tightness and shortness of breath.   Cardiovascular: Negative for chest pain, palpitations and leg swelling.  Gastrointestinal: Negative for abdominal pain, blood in stool, diarrhea and nausea.  Endocrine: Negative for cold intolerance, polydipsia, polyphagia and polyuria.  Genitourinary: Negative for dysuria, flank pain, hematuria and urgency.  Musculoskeletal: Negative for arthralgias.  Skin: Negative for rash.  Neurological: Negative for dizziness and headaches.  Psychiatric/Behavioral: Negative for dysphoric mood and sleep disturbance. The patient is not  nervous/anxious.     Allergies Patient has No Known Allergies.  Past Medical History Patient  has a past medical history of HSV (herpes simplex virus) anogenital infection, varicella, Medical history non-contributory, PAC (premature atrial contraction) (01/26/2017), Palpitation (12/10/2016), and PVC (premature ventricular contraction) (01/26/2017).  Surgical History Patient  has a past surgical history that includes Cesarean section (N/A, 12/06/2014) and Cesarean section (N/A, 11/29/2015).  Family History Pateint's family history includes Atrial fibrillation in her father; Heart attack in her paternal grandfather; Stroke in her maternal grandfather; Von Willebrand disease in her brother.  Social History Patient  reports that she has never smoked. She has never used smokeless tobacco. She reports current alcohol use. She reports that she does not use drugs.    Objective: Vitals:   06/15/20 0856  BP: 90/60  Pulse: 72  Temp: 98.2 F (36.8 C)  TempSrc: Temporal  SpO2: 98%  Weight: 120 lb (54.4 kg)  Height: 5\' 2"  (1.575 m)    Body mass index is 21.95 kg/m.  Physical Exam Vitals reviewed.  Constitutional:      Appearance: Normal appearance. She is well-developed, normal weight and well-nourished.  HENT:     Head: Normocephalic and atraumatic.     Right Ear: Tympanic membrane, ear canal and external ear normal.     Left Ear: Tympanic membrane, ear canal and external ear normal.     Nose: Nose normal.     Mouth/Throat:     Mouth: Oropharynx is clear and moist. Mucous membranes are moist.  Eyes:     Extraocular Movements: Extraocular movements intact and EOM normal.     Conjunctiva/sclera: Conjunctivae normal.     Pupils: Pupils  are equal, round, and reactive to light.  Neck:     Thyroid: No thyromegaly.     Vascular: No carotid bruit.  Cardiovascular:     Rate and Rhythm: Normal rate and regular rhythm.     Pulses: Normal pulses and intact distal pulses.     Heart sounds:  Normal heart sounds. No murmur heard.   Pulmonary:     Effort: Pulmonary effort is normal.     Breath sounds: Normal breath sounds.  Abdominal:     General: Abdomen is flat. Bowel sounds are normal. There is no distension.     Palpations: Abdomen is soft.     Tenderness: There is no abdominal tenderness.  Musculoskeletal:     Cervical back: Normal range of motion and neck supple.  Lymphadenopathy:     Cervical: No cervical adenopathy.  Skin:    General: Skin is warm and dry.     Capillary Refill: Capillary refill takes less than 2 seconds.     Findings: No rash.  Neurological:     General: No focal deficit present.     Mental Status: She is alert and oriented to person, place, and time.     Cranial Nerves: No cranial nerve deficit.     Coordination: Coordination normal.     Deep Tendon Reflexes: Reflexes normal.  Psychiatric:        Mood and Affect: Mood and affect and mood normal.        Behavior: Behavior normal.        [facility] Office Visit from 06/15/2020 in [place]  PHQ-2 Total Score 0     GAD 7 : Generalized Anxiety Score 06/15/2020  Nervous, Anxious, on Edge 1  Control/stop worrying 1  Worry too much - different things 0  Trouble relaxing 0  Restless 0  Easily annoyed or irritable 2  Afraid - awful might happen 1  Total GAD 7 Score 5  Anxiety Difficulty Not difficult at all     Assessment/plan: 1. Annual physical exam Routine fasting labs today. HM reviewed and she is UTD> need records for pap/mmg. Very fit and healthy and active. Continue healthy lifestyle. F/u in one year Patient counseling [x]    Nutrition: Stressed importance of moderation in sodium/caffeine intake, saturated fat and cholesterol, caloric balance, sufficient intake of fresh fruits, vegetables, fiber, calcium, iron, and 1 mg of folate supplement per day (for females capable of pregnancy).  [x]    Stressed the importance of regular exercise.   []    Substance  Abuse: Discussed cessation/primary prevention of tobacco, alcohol, or other drug use; driving or other dangerous activities under the influence; availability of treatment for abuse.   [x]    Injury prevention: Discussed safety belts, safety helmets, smoke detector, smoking near bedding or upholstery.   [x]    Sexuality: Discussed sexually transmitted diseases, partner selection, use of condoms, avoidance of unintended pregnancy  and contraceptive alternatives.  [x]    Dental health: Discussed importance of regular tooth brushing, flossing, and dental visits.  [x]    Health maintenance and immunizations reviewed. Please refer to Health maintenance section.    - CBC with Differential/Platelet - Comprehensive metabolic panel - Lipid panel - TSH  2. Encounter for hepatitis C screening test for low risk patient  - Hepatitis C antibody  3. GAD (generalized anxiety disorder) GAD7 is mild and she is very well controlled. Doesn't really take medication, but has if needed. Very good with exercise and will do counseling if needed, but overall  doing well at this time.     This visit occurred during the SARS-CoV-2 public health emergency.  Safety protocols were in place, including screening questions prior to the visit, additional usage of staff PPE, and extensive cleaning of exam room while observing appropriate contact time as indicated for disinfecting solutions.     Return in about 1 year (around 06/15/2021) for annual .     Orma Flaming, MD [place]  06/15/2020

## 2020-06-18 ENCOUNTER — Other Ambulatory Visit: Payer: Self-pay | Admitting: Family Medicine

## 2020-06-18 ENCOUNTER — Encounter: Payer: Self-pay | Admitting: Family Medicine

## 2020-06-18 DIAGNOSIS — E039 Hypothyroidism, unspecified: Secondary | ICD-10-CM | POA: Insufficient documentation

## 2020-06-18 LAB — HEPATITIS C ANTIBODY
Hepatitis C Ab: NONREACTIVE
SIGNAL TO CUT-OFF: 0.02 (ref <1.00)

## 2020-06-18 MED ORDER — LEVOTHYROXINE SODIUM 25 MCG PO TABS: 25.0000 ug | ORAL_TABLET | Freq: Every day | ORAL | 0 refills | Status: DC

## 2020-06-21 ENCOUNTER — Encounter: Payer: Self-pay | Admitting: Family Medicine

## 2020-06-21 ENCOUNTER — Other Ambulatory Visit: Payer: Self-pay

## 2020-06-21 DIAGNOSIS — E039 Hypothyroidism, unspecified: Secondary | ICD-10-CM

## 2020-07-08 ENCOUNTER — Other Ambulatory Visit: Payer: Self-pay | Admitting: Family Medicine

## 2020-07-30 ENCOUNTER — Other Ambulatory Visit: Payer: Self-pay

## 2020-07-30 ENCOUNTER — Encounter: Payer: Self-pay | Admitting: Endocrinology

## 2020-07-30 ENCOUNTER — Ambulatory Visit (INDEPENDENT_AMBULATORY_CARE_PROVIDER_SITE_OTHER): Payer: 59 | Admitting: Endocrinology

## 2020-07-30 VITALS — BP 110/68 | HR 63 | Ht 62.0 in | Wt 124.4 lb

## 2020-07-30 DIAGNOSIS — E039 Hypothyroidism, unspecified: Secondary | ICD-10-CM | POA: Diagnosis not present

## 2020-07-30 LAB — TSH: TSH: 2.89 u[IU]/mL (ref 0.35–4.50)

## 2020-07-30 LAB — T4, FREE: Free T4: 0.79 ng/dL (ref 0.60–1.60)

## 2020-07-30 NOTE — Progress Notes (Signed)
Subjective:    Patient ID: Ashley Mendez, female    DOB: [birth year], 40 y.o.   MRN: [id number]  HPI Pt is referred by Dr Rogers Blocker, for hypothyroidism.  This was dx'ed in 2019.  she has never taken kelp or any other type of non-prescribed thyroid product.  she has never had thyroid imaging.  She is not considering a pregnancy.  she has never had thyroid surgery, or XRT to the neck.  she has never been on amiodarone or lithium.  She reports dry skin and hair loss.  She was rx'ed synthroid.  Since then, she has lost a few lbs, and fatigue is less.  She has reg menses.   Past Medical History:  Diagnosis Date   HSV (herpes simplex virus) anogenital infection    Hx of varicella    Medical history non-contributory    PAC (premature atrial contraction) 01/26/2017   Palpitation 12/10/2016   PVC (premature ventricular contraction) 01/26/2017    Past Surgical History:  Procedure Laterality Date   CESAREAN SECTION N/A 12/06/2014   Procedure: CESAREAN SECTION;  Surgeon: Jerelyn Charles, MD;  Location: [facility];  Service: Obstetrics;  Laterality: N/A;   CESAREAN SECTION N/A 11/29/2015   Procedure: REPEAT CESAREAN SECTION, TWO DESSEL CORDS;  Surgeon: Jerelyn Charles, MD;  Location: [facility];  Service: Obstetrics;  Laterality: N/A;    Social History   Socioeconomic History   Marital status: Married    Spouse name: Not on file   Number of children: Not on file   Years of education: Not on file   Highest education level: Not on file  Occupational History   Not on file  Tobacco Use   Smoking status: Never Smoker   Smokeless tobacco: Never Used  Substance and Sexual Activity   Alcohol use: Yes   Drug use: No   Sexual activity: Yes    Partners: Male  Other Topics Concern   Not on file  Social History Narrative   Not on file   Social Determinants of Health   Financial Resource Strain: Not on file  Food Insecurity: Not on file  Transportation Needs: Not on file  Physical  Activity: Not on file  Stress: Not on file  Social Connections: Not on file  Intimate Partner Violence: Not on file    Current Outpatient Medications on File Prior to Visit  Medication Sig Dispense Refill   busPIRone (BUSPAR) 5 MG tablet START TAKING 1 TABLET BY MOUTH ONCE DAILY THEN INCREASE TO TWICE A DAY AS NEEDED 180 tablet 1   levothyroxine (SYNTHROID) 25 MCG tablet Take 1 tablet (25 mcg total) by mouth daily before breakfast. Repeat labs in 6-8 weeks. 90 tablet 0   Multiple Vitamins-Iron (MULTIVITAMINS WITH IRON) TABS tablet Take by mouth.     oseltamivir (TAMIFLU) 75 MG capsule Take 1 capsule (75 mg total) by mouth every 12 (twelve) hours. 10 capsule 0   No current facility-administered medications on file prior to visit.    No Known Allergies  Family History  Problem Relation Age of Onset   Hypothyroidism Mother    Atrial fibrillation Father    Von Willebrand disease Brother    Stroke Maternal Grandfather    Heart attack Paternal Grandfather     BP 110/68 (BP Location: Right Arm, Patient Position: Sitting, Cuff Size: Normal)    Pulse 63    Ht 5\' 2"  (1.575 m)    Wt 124 lb 6.4 oz (56.4 kg)    SpO2 98%  BMI 22.75 kg/m    Review of Systems denies weight gain, difficulty with concentration, constipation, numbness, cold intolerance.  She has mild anxiety.      Objective:   Physical Exam VS: see vs page GEN: no distress HEAD: head: no deformity eyes: no periorbital swelling, no proptosis external nose and ears are normal NECK: supple, thyroid is not enlarged CHEST WALL: no deformity LUNGS: clear to auscultation CV: reg rate and rhythm, no murmur.  MUSCULOSKELETAL: gait is normal and steady EXTEMITIES: no deformity.  no leg edema NEURO:  readily moves all 4's.  sensation is intact to touch on all 4's SKIN:  Normal texture and temperature.  No rash or suspicious lesion is visible.   NODES:  None palpable at the neck PSYCH: alert, well-oriented.  Does not  appear anxious nor depressed.  Lab Results  Component Value Date   TSH 2.89 07/30/2020    I have reviewed outside records, and summarized: Pt was noted to have elevated TSH, and referred here.  Pt has good overall health.        Assessment & Plan:  Hypothyroidism, new to me: well-replaced.  Please continue the same synthroid.    Patient Instructions  Blood tests are requested for you today.  We'll let you know about the results.   Please come back for a follow-up appointment in 6 months.

## 2020-07-30 NOTE — Patient Instructions (Addendum)
Blood tests are requested for you today.  We'll let you know about the results.  Please come back for a follow-up appointment in 6 months.   

## 2020-09-09 ENCOUNTER — Encounter: Payer: Self-pay | Admitting: Family Medicine

## 2020-09-10 ENCOUNTER — Other Ambulatory Visit: Payer: Self-pay

## 2020-09-10 MED ORDER — LEVOTHYROXINE SODIUM 25 MCG PO TABS: 25.0000 ug | ORAL_TABLET | Freq: Every day | ORAL | 0 refills | Status: DC

## 2020-09-20 DIAGNOSIS — Z1231 Encounter for screening mammogram for malignant neoplasm of breast: Secondary | ICD-10-CM | POA: Diagnosis not present

## 2020-09-20 DIAGNOSIS — Z01419 Encounter for gynecological examination (general) (routine) without abnormal findings: Secondary | ICD-10-CM | POA: Diagnosis not present

## 2020-10-02 ENCOUNTER — Emergency Department (HOSPITAL_BASED_OUTPATIENT_CLINIC_OR_DEPARTMENT_OTHER)
Admission: EM | Admit: 2020-10-02 | Discharge: 2020-10-02 | Disposition: A | Discharge status: Home / Self Care | Payer: 59 | Attending: Emergency Medicine | Admitting: Emergency Medicine

## 2020-10-02 ENCOUNTER — Encounter: Payer: Self-pay | Admitting: Physician Assistant

## 2020-10-02 ENCOUNTER — Telehealth: Payer: 59 | Admitting: Physician Assistant

## 2020-10-02 ENCOUNTER — Other Ambulatory Visit: Payer: Self-pay

## 2020-10-02 ENCOUNTER — Encounter (HOSPITAL_BASED_OUTPATIENT_CLINIC_OR_DEPARTMENT_OTHER): Payer: Self-pay | Admitting: Emergency Medicine

## 2020-10-02 ENCOUNTER — Emergency Department (HOSPITAL_BASED_OUTPATIENT_CLINIC_OR_DEPARTMENT_OTHER): Payer: 59

## 2020-10-02 DIAGNOSIS — R079 Chest pain, unspecified: Secondary | ICD-10-CM

## 2020-10-02 DIAGNOSIS — U071 COVID-19: Secondary | ICD-10-CM | POA: Diagnosis not present

## 2020-10-02 DIAGNOSIS — R0789 Other chest pain: Secondary | ICD-10-CM | POA: Diagnosis not present

## 2020-10-02 DIAGNOSIS — E039 Hypothyroidism, unspecified: Secondary | ICD-10-CM | POA: Diagnosis not present

## 2020-10-02 DIAGNOSIS — Z79899 Other long term (current) drug therapy: Secondary | ICD-10-CM | POA: Insufficient documentation

## 2020-10-02 HISTORY — DX: Disorder of thyroid, unspecified: E07.9

## 2020-10-02 LAB — COMPREHENSIVE METABOLIC PANEL
ALT: 13 U/L (ref 0–44)
AST: 18 U/L (ref 15–41)
Albumin: 4.7 g/dL (ref 3.5–5.0)
Alkaline Phosphatase: 31 U/L — ABNORMAL LOW (ref 38–126)
Anion gap: 8 (ref 5–15)
BUN: 9 mg/dL (ref 6–20)
CO2: 28 mmol/L (ref 22–32)
Calcium: 9.4 mg/dL (ref 8.9–10.3)
Chloride: 103 mmol/L (ref 98–111)
Creatinine, Ser: 0.6 mg/dL (ref 0.44–1.00)
GFR, Estimated: >60 mL/min (ref >60)
Glucose, Bld: 98 mg/dL (ref 70–99)
Potassium: 3.5 mmol/L (ref 3.5–5.1)
Sodium: 139 mmol/L (ref 135–145)
Total Bilirubin: 0.6 mg/dL (ref 0.3–1.2)
Total Protein: 7.4 g/dL (ref 6.5–8.1)

## 2020-10-02 LAB — CBC
HCT: 39.8 % (ref 36.0–46.0)
Hemoglobin: 13.5 g/dL (ref 12.0–15.0)
MCH: 31.7 pg (ref 26.0–34.0)
MCHC: 33.9 g/dL (ref 30.0–36.0)
MCV: 93.4 fL (ref 80.0–100.0)
Platelets: 205 10*3/uL (ref 150–400)
RBC: 4.26 MIL/uL (ref 3.87–5.11)
RDW: 12.7 % (ref 11.5–15.5)
WBC: 4.8 10*3/uL (ref 4.0–10.5)
nRBC: 0 % (ref 0.0–0.2)

## 2020-10-02 LAB — TROPONIN I (HIGH SENSITIVITY): Troponin I (High Sensitivity): <2 ng/L (ref <18)

## 2020-10-02 MED ORDER — NIRMATRELVIR/RITONAVIR (PAXLOVID)TABLET
ORAL_TABLET | ORAL | 0 refills | Status: DC
Start: 2020-10-02 — End: 2020-11-09

## 2020-10-02 NOTE — Patient Instructions (Signed)
1. COVID-19  - Overall mild symptoms onset 3 days ago.  - Continue symptomatic therapy at home  - Home medications verified.  Patient will stop multivitamin.  She takes BuSpar as needed and will not take this while taking antiviral.  It is safe for her to continue levothyroxine.  - Will prescribe Paxlovid  2. Chest pain, unspecified type  -Chest pain and shortness of breath in the setting of COVID-19 give rise to concern for atypical pneumonia, myocarditis or pulmonary embolism.  I have recommended immediate evaluation in the emergency department for further work-up.  Pharmacy: [facility] [place], [place]

## 2020-10-02 NOTE — ED Notes (Signed)
Labs drawn by previous shift.

## 2020-10-02 NOTE — Progress Notes (Signed)
[name], xu are scheduled for a virtual visit with your provider today.    Just as we do with appointments in the office, we must obtain your consent to participate.  Your consent will be active for this visit and any virtual visit you may have with one of our providers in the next 365 days.    If you have a MyChart account, I can also send a copy of this consent to you electronically.  All virtual visits are billed to your insurance company just like a traditional visit in the office.  As this is a virtual visit, video technology does not allow for your provider to perform a traditional examination.  This may limit your provider's ability to fully assess your condition.  If your provider identifies any concerns that need to be evaluated in person or the need to arrange testing such as labs, EKG, etc, we will make arrangements to do so.    Although advances in technology are sophisticated, we cannot ensure that it will always work on either your end or our end.  If the connection with a video visit is poor, we may have to switch to a telephone visit.  With either a video or telephone visit, we are not always able to ensure that we have a secure connection.   I need to obtain your verbal consent now.   Are you willing to proceed with your visit today?   [name] has provided verbal consent on 10/02/2020 for a virtual visit (video or telephone).   Abigail Butts, PA-C 10/02/2020  5:53 PM   Date:  10/02/2020   ID:  Ashley Mendez, DOB April 14, 1981, MRN [id number]  Patient Location: Home Provider Location: Home Office   Participants: Patient and Provider for Visit and Wrap up  Method of visit: Video  Location of Patient: Home Location of Provider: Home Office Consent was obtain for visit over the video. Services rendered by provider: Visit was performed via video  A video enabled telemedicine application was used and I verified that I am speaking with the correct person using  two identifiers.  PCP:  Orma Flaming, MD   Chief Complaint:  COVID +  History of Present Illness:    Ashley Mendez is a 40 y.o. female with history as stated below. Presents video telehealth for an acute care visit.  Patient reports her entire family has COVID-19.  She states she started with mild symptoms on Sunday.  She reports other symptoms include general fatigue and head cold type symptoms with headache and congestion.  She reports 2 negative antigen test and a positive PCR this morning.    Additionally patient reports this morning she developed some left-sided chest pain with shortness of breath.  It is worse with deep breathing and movement.  She reports its been constant throughout the day.  She has no history of pulmonary embolism, myocarditis.  She does not take an oral birth control.  No specific aggravating or alleviating factors for her chest pain.  The patient does have symptoms concerning for COVID-19 infection (fever, chills, cough, or new shortness of breath).  Patient has been tested for COVID during this illness with a positive result..  Past Medical, Surgical, Social History, Allergies, and Medications have been Reviewed.  Patient Active Problem List   Diagnosis Date Noted  . Hypothyroid 06/18/2020  . GAD (generalized anxiety disorder) 06/15/2020  . PAC (premature atrial contraction) 01/26/2017  . PVC (premature ventricular contraction) 01/26/2017    Social History  Tobacco Use  . Smoking status: Never Smoker  . Smokeless tobacco: Never Used  Substance Use Topics  . Alcohol use: Yes     Current Outpatient Medications:  .  nirmatrelvir/ritonavir EUA (PAXLOVID) TABS, Take nirmatrelvir (150 mg) two tablets twice daily for 5 days and ritonavir (100 mg) one tablet twice daily for 5 days., Disp: 30 tablet, Rfl: 0 .  busPIRone (BUSPAR) 5 MG tablet, START TAKING 1 TABLET BY MOUTH ONCE DAILY THEN INCREASE TO TWICE A DAY AS NEEDED, Disp: 180 tablet, Rfl: 1 .   levothyroxine (SYNTHROID) 25 MCG tablet, Take 1 tablet (25 mcg total) by mouth daily before breakfast. Repeat labs in 6-8 weeks., Disp: 90 tablet, Rfl: 0 .  Multiple Vitamins-Iron (MULTIVITAMINS WITH IRON) TABS tablet, Take by mouth., Disp: , Rfl:  .  oseltamivir (TAMIFLU) 75 MG capsule, Take 1 capsule (75 mg total) by mouth every 12 (twelve) hours., Disp: 10 capsule, Rfl: 0   No Known Allergies   Review of Systems  Constitutional: Positive for malaise/fatigue. Negative for chills and fever.  HENT: Positive for congestion and sore throat. Negative for ear pain.   Eyes: Negative for blurred vision and double vision.  Respiratory: Positive for shortness of breath. Negative for cough and wheezing.   Cardiovascular: Positive for chest pain. Negative for palpitations and leg swelling.  Gastrointestinal: Negative for abdominal pain, diarrhea, nausea and vomiting.  Genitourinary: Negative for dysuria.  Musculoskeletal: Negative for myalgias.  Skin: Negative for rash.  Neurological: Positive for headaches. Negative for loss of consciousness and weakness.  Psychiatric/Behavioral: The patient is not nervous/anxious.    See HPI for history of present illness.  Physical Exam Constitutional:      General: She is not in acute distress.    Appearance: Normal appearance. She is not ill-appearing.  HENT:     Head: Normocephalic and atraumatic.     Nose: No congestion.  Eyes:     Extraocular Movements: Extraocular movements intact.  Pulmonary:     Effort: Pulmonary effort is normal.     Comments: Speaks in full sentences; no accessory muscle usage at rest. Musculoskeletal:        General: Normal range of motion.     Cervical back: Normal range of motion.  Skin:    Coloration: Skin is not pale.  Neurological:     General: No focal deficit present.     Mental Status: She is alert. Mental status is at baseline.  Psychiatric:        Mood and Affect: Mood normal.               A&P  1.  COVID-19  - Overall mild symptoms onset 3 days ago.  - Continue symptomatic therapy at home  - Home medications verified.  Patient will stop multivitamin.  She takes BuSpar as needed and will not take this while taking antiviral.  It is safe for her to continue levothyroxine.  - Will prescribe Paxlovid  2. Chest pain, unspecified type  -Chest pain and shortness of breath in the setting of COVID-19 give rise to concern for atypical pneumonia, myocarditis or pulmonary embolism.  I have recommended immediate evaluation in the emergency department for further work-up.    Patient voiced understanding and agreement to plan.   Time:   Today, I have spent 15 minutes with the patient with telehealth technology discussing the above problems, reviewing the chart, previous notes, medications and orders.    Tests Ordered: No orders of the defined types were  placed in this encounter.   Medication Changes: Meds ordered this encounter  Medications  . nirmatrelvir/ritonavir EUA (PAXLOVID) TABS    Sig: Take nirmatrelvir (150 mg) two tablets twice daily for 5 days and ritonavir (100 mg) one tablet twice daily for 5 days.    Dispense:  30 tablet    Refill:  0     Disposition:  Follow up in ED tonight  Celso Sickle, PA-C  10/02/2020 6:24 PM

## 2020-10-02 NOTE — ED Provider Notes (Signed)
[facility] EMERGENCY DEPT Provider Note   CSN: [id number] Arrival date & time: 10/02/20  1830     History Chief Complaint  Patient presents with  . Chest Pain  . Shortness of Breath    Ashley Mendez is a 40 y.o. female.  The history is provided by the patient.  Chest Pain Pain location:  L chest Pain quality: sharp   Pain radiates to:  Neck and L arm Pain severity:  Mild Onset quality:  Sudden Duration: awoke this am with the pain. Timing:  Constant Progression:  Unchanged Chronicity:  New Context comment:  COVID-19 positive, symptoms x 2 days; fully vaccinated and boosted Relieved by:  Nothing Worsened by:  Nothing Associated symptoms: shortness of breath   Associated symptoms: no abdominal pain, no back pain, no cough, no fever, no palpitations and no vomiting   Risk factors: no coronary artery disease and no prior DVT/PE   Shortness of Breath Associated symptoms: chest pain   Associated symptoms: no abdominal pain, no cough, no ear pain, no fever, no rash, no sore throat and no vomiting        Past Medical History:  Diagnosis Date  . HSV (herpes simplex virus) anogenital infection   . Hx of varicella   . Medical history non-contributory   . PAC (premature atrial contraction) 01/26/2017  . Palpitation 12/10/2016  . PVC (premature ventricular contraction) 01/26/2017  . Thyroid disease     Patient Active Problem List   Diagnosis Date Noted  . Hypothyroid 06/18/2020  . GAD (generalized anxiety disorder) 06/15/2020  . PAC (premature atrial contraction) 01/26/2017  . PVC (premature ventricular contraction) 01/26/2017    Past Surgical History:  Procedure Laterality Date  . CESAREAN SECTION N/A 12/06/2014   Procedure: CESAREAN SECTION;  Surgeon: Jerelyn Charles, MD;  Location: [facility];  Service: Obstetrics;  Laterality: N/A;  . CESAREAN SECTION N/A 11/29/2015   Procedure: REPEAT CESAREAN SECTION, TWO DESSEL CORDS;  Surgeon: Jerelyn Charles, MD;   Location: [facility];  Service: Obstetrics;  Laterality: N/A;     OB History    Gravida  2   Para  2   Term  2   Preterm      AB      Living  2     SAB      IAB      Ectopic      Multiple  0   Live Births  2           Family History  Problem Relation Age of Onset  . Hypothyroidism Mother   . Atrial fibrillation Father   . Von Willebrand disease Brother   . Stroke Maternal Grandfather   . Heart attack Paternal Grandfather     Social History   Tobacco Use  . Smoking status: Never Smoker  . Smokeless tobacco: Never Used  Substance Use Topics  . Alcohol use: Yes  . Drug use: No    Home Medications Prior to Admission medications   Medication Sig Start Date End Date Taking? Authorizing Provider  busPIRone (BUSPAR) 5 MG tablet START TAKING 1 TABLET BY MOUTH ONCE DAILY THEN INCREASE TO TWICE A DAY AS NEEDED 07/08/20   Orma Flaming, MD  levothyroxine (SYNTHROID) 25 MCG tablet Take 1 tablet (25 mcg total) by mouth daily before breakfast. Repeat labs in 6-8 weeks. 09/10/20   Orma Flaming, MD  Multiple Vitamins-Iron (MULTIVITAMINS WITH IRON) TABS tablet Take by mouth.    [provider]  nirmatrelvir/ritonavir EUA (  PAXLOVID) TABS Take nirmatrelvir (150 mg) two tablets twice daily for 5 days and ritonavir (100 mg) one tablet twice daily for 5 days. 10/02/20   Muthersbaugh, Jarrett Soho, PA-C  oseltamivir (TAMIFLU) 75 MG capsule Take 1 capsule (75 mg total) by mouth every 12 (twelve) hours. 05/29/18   Wurst, [place], PA-C    Allergies    Patient has no known allergies.  Review of Systems   Review of Systems  Constitutional: Negative for chills and fever.  HENT: Negative for ear pain and sore throat.   Eyes: Negative for pain and visual disturbance.  Respiratory: Positive for shortness of breath. Negative for cough.   Cardiovascular: Positive for chest pain. Negative for palpitations.  Gastrointestinal: Negative for abdominal pain and vomiting.   Genitourinary: Negative for dysuria and hematuria.  Musculoskeletal: Negative for arthralgias and back pain.  Skin: Negative for color change and rash.  Neurological: Negative for seizures and syncope.  All other systems reviewed and are negative.   Physical Exam Updated Vital Signs BP 108/88 (BP Location: Right Arm)   Pulse 70   Temp 99.2 F (37.3 C) (Oral)   Resp 12   Ht 5\' 2"  (1.575 m)   Wt 54.4 kg   LMP 09/11/2020   SpO2 100%   BMI 21.95 kg/m   Physical Exam Vitals and nursing note reviewed.  Constitutional:      General: She is not in acute distress.    Appearance: She is well-developed.  HENT:     Head: Normocephalic and atraumatic.  Eyes:     Conjunctiva/sclera: Conjunctivae normal.  Cardiovascular:     Rate and Rhythm: Normal rate and regular rhythm.     Heart sounds: No murmur heard.   Pulmonary:     Effort: Pulmonary effort is normal. No respiratory distress.     Breath sounds: Normal breath sounds.  Abdominal:     Palpations: Abdomen is soft.     Tenderness: There is no abdominal tenderness.  Musculoskeletal:     Cervical back: Neck supple.  Skin:    General: Skin is warm and dry.  Neurological:     General: No focal deficit present.     Mental Status: She is alert.  Psychiatric:        Mood and Affect: Mood normal.     ED Results / Procedures / Treatments   Labs (all labs ordered are listed, but only abnormal results are displayed) Labs Reviewed  COMPREHENSIVE METABOLIC PANEL - Abnormal; Notable for the following components:      Result Value   Alkaline Phosphatase 31 (*)    All other components within normal limits  CBC  TROPONIN I (HIGH SENSITIVITY)    EKG EKG Interpretation  Date/Time:  Tuesday Oct 02 2020 18:43:19 EDT Ventricular Rate:  76 PR Interval:  124 QRS Duration: 87 QT Interval:  385 QTC Calculation: 433 R Axis:   75 Text Interpretation: Sinus rhythm Normal axis No acute ischemia Confirmed by Lorre Munroe ([id number]) on  10/02/2020 7:19:41 PM   Radiology DG Chest Port 1 View  Result Date: 10/02/2020 CLINICAL DATA:  Chest pain.  COVID positive. EXAM: PORTABLE CHEST 1 VIEW COMPARISON:  None. FINDINGS: The cardiomediastinal contours are normal. The lungs are clear. Pulmonary vasculature is normal. No consolidation, pleural effusion, or pneumothorax. No acute osseous abnormalities are seen. IMPRESSION: No acute chest findings. Electronically Signed   By: Keith Rake M.D.   On: 10/02/2020 19:36    Procedures Procedures   Medications Ordered in ED Medications -  No data to display  ED Course  I have reviewed the triage vital signs and the nursing notes.  Pertinent labs & imaging results that were available during my care of the patient were reviewed by me and considered in my medical decision making (see chart for details).  This patient was evaluated during a time of global shortage of iodinated contrast media. Based on guidance from the [employer] of Radiology, best practices, and local institutional approaches an alternative path for evaluating and managing the patient may have been employed in order to provide optimal care during this shortage. The current situation has been discussed with the patient    MDM Rules/Calculators/A&P                          [name] presents with chest pain and some mild shortness of breath in the setting of COVID-19.  She is well-appearing with normal vital signs.  She has been told to come to the ED over concerns for possible acute coronary syndrome or PE.  The patient does not have evidence of acute coronary syndrome.  Her heart score is 0, and her first troponin is less than 2.  Enough time has elapsed since the onset of her chest pain that no second troponin is needed.  I talked to her about the possibility of a blood clot.  She is essentially PERC negative which, is not a till that has been validated in the setting of COVID-19, but is nonetheless  reassuring.  I do not think the risk/benefit of adding a D-dimer to her work-up is warranted given the global shortage of IV contrast dye and the very low likelihood that she actually has a blood clot.  She was counseled to obtain a pulse oximeter, and we talked about return precautions based on her heart rate and oxygen saturation. Final Clinical Impression(s) / ED Diagnoses Final diagnoses:  Chest pain, unspecified type  COVID-19    Rx / DC Orders ED Discharge Orders    None       Arnaldo Natal, MD 10/02/20 2008

## 2020-10-02 NOTE — ED Triage Notes (Signed)
Left chest pressure and shortness of breath, covid positive x 1 day . Sore throat.

## 2020-10-16 ENCOUNTER — Telehealth: Payer: Self-pay

## 2020-10-16 DIAGNOSIS — R140 Abdominal distension (gaseous): Secondary | ICD-10-CM | POA: Diagnosis not present

## 2020-10-16 NOTE — Telephone Encounter (Signed)
Attempted to contact patient to schedule [facility] appointment with [facility] [place] [facility] in July  but unable to LVM due to mailbox being full.

## 2020-11-08 ENCOUNTER — Other Ambulatory Visit: Payer: Self-pay

## 2020-11-09 ENCOUNTER — Encounter: Payer: Self-pay | Admitting: Adult Health

## 2020-11-09 ENCOUNTER — Ambulatory Visit: Payer: 59 | Admitting: Adult Health

## 2020-11-09 VITALS — BP 98/60 | HR 63 | Temp 98.1°F | Ht 61.5 in | Wt 124.0 lb

## 2020-11-09 DIAGNOSIS — E039 Hypothyroidism, unspecified: Secondary | ICD-10-CM | POA: Diagnosis not present

## 2020-11-09 DIAGNOSIS — Z7689 Persons encountering health services in other specified circumstances: Secondary | ICD-10-CM | POA: Diagnosis not present

## 2020-11-09 DIAGNOSIS — F411 Generalized anxiety disorder: Secondary | ICD-10-CM

## 2020-11-09 MED ORDER — LEVOTHYROXINE SODIUM 25 MCG PO TABS: 25.0000 ug | ORAL_TABLET | Freq: Every day | ORAL | 3 refills | Status: DC

## 2020-11-09 NOTE — Patient Instructions (Signed)
It was great meeting you today!  I have sent in your Synthroid for a year   I will see you in February 2023 for your physical

## 2020-11-09 NOTE — Progress Notes (Signed)
Patient presents to clinic today to establish care. She is a very pleasant, active, and healthy 40 year old female who  has a past medical history of HSV (herpes simplex virus) anogenital infection, varicella, Medical history non-contributory, PAC (premature atrial contraction) (01/26/2017), Palpitation (12/10/2016), PVC (premature ventricular contraction) (01/26/2017), and Thyroid disease.  She is a Armed forces technical officer for [employer]  Previous patient of Dr. Rogers Blocker. Her last CPE was in 06/2020  Acute Concerns: Establish Care   Chronic Issues: Hypothyroidism - Takes Synthroid 25 mcg daily. Feels controlled on this medication. Needs refill.  Lab Results  Component Value Date   TSH 2.89 07/30/2020   Anxiety -notes as though she has had this her entire life.  Does have a prescription for BuSpar that she takes sparingly.She does feel well controlled.   Health Maintenance: Dental --Routine  Vision -- Routine  Immunizations -- UTD Colonoscopy -- Never had. No first degree relatives with colon cancer. Grandfather ultimately passed from colon cancer screening PAP -- UTD Diet: Eats healthy  Exercise: Very active. Enjoys riding her pelaton    Past Medical History:  Diagnosis Date   HSV (herpes simplex virus) anogenital infection    Hx of varicella    Medical history non-contributory    PAC (premature atrial contraction) 01/26/2017   Palpitation 12/10/2016   PVC (premature ventricular contraction) 01/26/2017   Thyroid disease     Past Surgical History:  Procedure Laterality Date   CESAREAN SECTION N/A 12/06/2014   Procedure: CESAREAN SECTION;  Surgeon: Jerelyn Charles, MD;  Location: [facility];  Service: Obstetrics;  Laterality: N/A;   CESAREAN SECTION N/A 11/29/2015   Procedure: REPEAT CESAREAN SECTION, TWO DESSEL CORDS;  Surgeon: Jerelyn Charles, MD;  Location: [facility];  Service: Obstetrics;  Laterality: N/A;    Current Outpatient Medications on File Prior to Visit  Medication  Sig Dispense Refill   busPIRone (BUSPAR) 5 MG tablet START TAKING 1 TABLET BY MOUTH ONCE DAILY THEN INCREASE TO TWICE A DAY AS NEEDED 180 tablet 1   levothyroxine (SYNTHROID) 25 MCG tablet Take 1 tablet (25 mcg total) by mouth daily before breakfast. Repeat labs in 6-8 weeks. 90 tablet 0   Multiple Vitamins-Iron (MULTIVITAMINS WITH IRON) TABS tablet Take by mouth.     nirmatrelvir/ritonavir EUA (PAXLOVID) TABS Take nirmatrelvir (150 mg) two tablets twice daily for 5 days and ritonavir (100 mg) one tablet twice daily for 5 days. 30 tablet 0   No current facility-administered medications on file prior to visit.    No Known Allergies  Family History  Problem Relation Age of Onset   Hypothyroidism Mother    Atrial fibrillation Father    Von Willebrand disease Brother    Stroke Maternal Grandfather    Heart attack Paternal Grandfather     Social History   Socioeconomic History   Marital status: Married    Spouse name: Not on file   Number of children: Not on file   Years of education: Not on file   Highest education level: Not on file  Occupational History   Not on file  Tobacco Use   Smoking status: Never   Smokeless tobacco: Never  Substance and Sexual Activity   Alcohol use: Yes   Drug use: No   Sexual activity: Yes    Partners: Male  Other Topics Concern   Not on file  Social History Narrative   Not on file   Social Determinants of Health   Financial Resource Strain: Not on file  Food Insecurity: Not on file  Transportation Needs: Not on file  Physical Activity: Not on file  Stress: Not on file  Social Connections: Not on file  Intimate Partner Violence: Not on file    ROS  BP 98/60   Pulse 63   Temp 98.1 F (36.7 C) (Other (Comment))   Ht 5' 1.5" (1.562 m)   Wt 124 lb (56.2 kg)   SpO2 98%   BMI 23.05 kg/m   Physical Exam  Recent Results (from the past 2160 hour(s))  CBC     Status: None   Collection Time: 10/02/20  6:42 PM  Result Value Ref Range    WBC 4.8 4.0 - 10.5 K/uL   RBC 4.26 3.87 - 5.11 MIL/uL   Hemoglobin 13.5 12.0 - 15.0 g/dL   HCT 39.8 36.0 - 46.0 %   MCV 93.4 80.0 - 100.0 fL   MCH 31.7 26.0 - 34.0 pg   MCHC 33.9 30.0 - 36.0 g/dL   RDW 12.7 11.5 - 15.5 %   Platelets 205 150 - 400 K/uL   nRBC 0.0 0.0 - 0.2 %    Comment: Performed at [facility], [address], [place], [place] [address]  Troponin I (High Sensitivity)     Status: None   Collection Time: 10/02/20  6:42 PM  Result Value Ref Range   Troponin I (High Sensitivity) <2 <18 ng/L    Comment: (NOTE) Elevated high sensitivity troponin I (hsTnI) values and significant  changes across serial measurements may suggest ACS but many other  chronic and acute conditions are known to elevate hsTnI results.  Refer to the "Links" section for chest pain algorithms and additional  guidance. Performed at [facility], [address], [place], [place] [address]   Comprehensive metabolic panel     Status: Abnormal   Collection Time: 10/02/20  6:42 PM  Result Value Ref Range   Sodium 139 135 - 145 mmol/L   Potassium 3.5 3.5 - 5.1 mmol/L   Chloride 103 98 - 111 mmol/L   CO2 28 22 - 32 mmol/L   Glucose, Bld 98 70 - 99 mg/dL    Comment: Glucose reference range applies only to samples taken after fasting for at least 8 hours.   BUN 9 6 - 20 mg/dL   Creatinine, Ser 0.60 0.44 - 1.00 mg/dL   Calcium 9.4 8.9 - 10.3 mg/dL   Total Protein 7.4 6.5 - 8.1 g/dL   Albumin 4.7 3.5 - 5.0 g/dL   AST 18 15 - 41 U/L   ALT 13 0 - 44 U/L   Alkaline Phosphatase 31 (L) 38 - 126 U/L   Total Bilirubin 0.6 0.3 - 1.2 mg/dL   GFR, Estimated >60 >60 mL/min    Comment: (NOTE) Calculated using the CKD-EPI Creatinine Equation (2021)    Anion gap 8 5 - 15    Comment: Performed at [facility], [address], [place],  [address]    Assessment/Plan: 1. Encounter to establish care -Very healthy 40 year old female.  Follow-up  in February 2023 for CPE or sooner if needed  2. Acquired hypothyroidism  - levothyroxine (SYNTHROID) 25 MCG tablet; Take 1 tablet (25 mcg total) by mouth daily before breakfast. Repeat labs in 6-8 weeks.  Dispense: 90 tablet; Refill: 3  3. GAD (generalized anxiety disorder) -Well-controlled.  Continue with BuSpar as needed  Dorothyann Peng, NP

## 2021-01-03 ENCOUNTER — Other Ambulatory Visit: Payer: Self-pay | Admitting: Family Medicine

## 2021-02-08 ENCOUNTER — Other Ambulatory Visit: Payer: Self-pay

## 2021-02-08 ENCOUNTER — Telehealth (INDEPENDENT_AMBULATORY_CARE_PROVIDER_SITE_OTHER): Payer: 59 | Admitting: Endocrinology

## 2021-02-08 VITALS — Ht 61.5 in | Wt 115.0 lb

## 2021-02-08 DIAGNOSIS — E039 Hypothyroidism, unspecified: Secondary | ICD-10-CM | POA: Diagnosis not present

## 2021-02-08 NOTE — Progress Notes (Signed)
Subjective:    Patient ID: Ashley Mendez, female    DOB: [birth year], 40 y.o.   MRN: [id number]  HPI telehealth visit today via video visit.   Alternatives to telehealth are presented to this patient, and the patient agrees to the telehealth visit. Pt is advised of the cost of the visit, and agrees to this, also.   Patient is at home, and I am at the office.   Persons attending the telehealth visit: the patient and I.   Pt returns for f/u of hypothyroidism (dx'ed 2019; she has never had thyroid imaging; she was rx'ed synthroid).  pt states she feels well in general, except for fatigue.  She is not considering a pregnancy.  She takes synthroid as rx'ed.   Past Medical History:  Diagnosis Date   HSV (herpes simplex virus) anogenital infection    Hx of varicella    Medical history non-contributory    PAC (premature atrial contraction) 01/26/2017   Palpitation 12/10/2016   PVC (premature ventricular contraction) 01/26/2017   Thyroid disease     Past Surgical History:  Procedure Laterality Date   CESAREAN SECTION N/A 12/06/2014   Procedure: CESAREAN SECTION;  Surgeon: Jerelyn Charles, MD;  Location: [facility];  Service: Obstetrics;  Laterality: N/A;   CESAREAN SECTION N/A 11/29/2015   Procedure: REPEAT CESAREAN SECTION, TWO DESSEL CORDS;  Surgeon: Jerelyn Charles, MD;  Location: [facility];  Service: Obstetrics;  Laterality: N/A;    Social History   Socioeconomic History   Marital status: Married    Spouse name: Not on file   Number of children: Not on file   Years of education: Not on file   Highest education level: Not on file  Occupational History   Not on file  Tobacco Use   Smoking status: Never   Smokeless tobacco: Never  Substance and Sexual Activity   Alcohol use: Yes   Drug use: No   Sexual activity: Yes    Partners: Male  Other Topics Concern   Not on file  Social History Narrative   Not on file   Social Determinants of Health   Financial Resource Strain: Not on  file  Food Insecurity: Not on file  Transportation Needs: Not on file  Physical Activity: Not on file  Stress: Not on file  Social Connections: Not on file  Intimate Partner Violence: Not on file    Current Outpatient Medications on File Prior to Visit  Medication Sig Dispense Refill   busPIRone (BUSPAR) 5 MG tablet START TAKING 1 TABLET BY MOUTH ONCE DAILY THEN INCREASE TO TWICE A DAY AS NEEDED 180 tablet 1   levothyroxine (SYNTHROID) 25 MCG tablet Take 1 tablet (25 mcg total) by mouth daily before breakfast. Repeat labs in 6-8 weeks. 90 tablet 3   Multiple Vitamins-Iron (MULTIVITAMINS WITH IRON) TABS tablet Take by mouth.     No current facility-administered medications on file prior to visit.    No Known Allergies  Family History  Problem Relation Age of Onset   Hypothyroidism Mother    Atrial fibrillation Father    Von Willebrand disease Brother    Stroke Maternal Grandfather    Heart attack Paternal Grandfather     Ht 5' 1.5" (1.562 m)   Wt 115 lb (52.2 kg)   BMI 21.38 kg/m    Review of Systems Denies neck swelling and pain.      Objective:   Physical Exam      Assessment & Plan:  Hypothyroidism, due to  recheck.  Please continue the same synthroid, pending that.   Patient Instructions  Blood tests are requested for you today.  We'll let you know about the results.   Please come back for a follow-up appointment in 6 months.

## 2021-02-08 NOTE — Patient Instructions (Signed)
Blood tests are requested for you today.  We'll let you know about the results.  Please come back for a follow-up appointment in 6 months.   

## 2021-03-11 ENCOUNTER — Telehealth (INDEPENDENT_AMBULATORY_CARE_PROVIDER_SITE_OTHER): Payer: 59 | Admitting: Family Medicine

## 2021-03-11 DIAGNOSIS — J0100 Acute maxillary sinusitis, unspecified: Secondary | ICD-10-CM

## 2021-03-11 MED ORDER — AMOXICILLIN-POT CLAVULANATE 875-125 MG PO TABS: 1.0000 | ORAL_TABLET | Freq: Two times a day (BID) | ORAL | 0 refills | Status: DC

## 2021-03-11 NOTE — Progress Notes (Signed)
Patient ID: Ashley Mendez, female   DOB: March 07, 1981, 40 y.o.   MRN: [id number]  This visit type was conducted due to national recommendations for restrictions regarding the COVID-19 pandemic in an effort to limit this patient's exposure and mitigate transmission in our community.   Virtual Visit via Video Note  I connected with Ashley Mendez on 03/11/21 at 11:45 AM EDT by a video enabled telemedicine application and verified that I am speaking with the correct person using two identifiers.  Location patient: home Location provider:work or home office Persons participating in the virtual visit: patient, provider  I discussed the limitations of evaluation and management by telemedicine and the availability of in person appointments. The patient expressed understanding and agreed to proceed.   HPI:  Aryah called with onset of respiratory illness about 10 days ago.  She has a couple children that were sick with similar symptoms.  They were tested for influenza, RSV, and COVID and all negative.  They seem to be recovering.  Patient had some nasal congestion and cough and seemed to be improving but then woke up this morning feeling worse again.  She has had some facial pain maxillary region with greenish nasal discharge.  She has had some upper teeth pain.  Increasing nasal congestion compared with the weekend.  No fever.  Has had sinusitis previously but not in several years.  No recent antibiotic use.  Generally healthy.  ROS: See pertinent positives and negatives per HPI.  Past Medical History:  Diagnosis Date   HSV (herpes simplex virus) anogenital infection    Hx of varicella    Medical history non-contributory    PAC (premature atrial contraction) 01/26/2017   Palpitation 12/10/2016   PVC (premature ventricular contraction) 01/26/2017   Thyroid disease     Past Surgical History:  Procedure Laterality Date   CESAREAN SECTION N/A 12/06/2014   Procedure: CESAREAN SECTION;  Surgeon: Jerelyn Charles, MD;  Location: [facility];  Service: Obstetrics;  Laterality: N/A;   CESAREAN SECTION N/A 11/29/2015   Procedure: REPEAT CESAREAN SECTION, TWO DESSEL CORDS;  Surgeon: Jerelyn Charles, MD;  Location: [facility];  Service: Obstetrics;  Laterality: N/A;    Family History  Problem Relation Age of Onset   Hypothyroidism Mother    Atrial fibrillation Father    Von Willebrand disease Brother    Stroke Maternal Grandfather    Heart attack Paternal Grandfather     SOCIAL HX: Non-smoker   Current Outpatient Medications:    amoxicillin-clavulanate (AUGMENTIN) 875-125 MG tablet, Take 1 tablet by mouth 2 (two) times daily., Disp: 20 tablet, Rfl: 0   busPIRone (BUSPAR) 5 MG tablet, START TAKING 1 TABLET BY MOUTH ONCE DAILY THEN INCREASE TO TWICE A DAY AS NEEDED, Disp: 180 tablet, Rfl: 1   levothyroxine (SYNTHROID) 25 MCG tablet, Take 1 tablet (25 mcg total) by mouth daily before breakfast. Repeat labs in 6-8 weeks., Disp: 90 tablet, Rfl: 3   Multiple Vitamins-Iron (MULTIVITAMINS WITH IRON) TABS tablet, Take by mouth., Disp: , Rfl:   EXAM:  VITALS per patient if applicable:  GENERAL: alert, oriented, appears well and in no acute distress  HEENT: atraumatic, conjunttiva clear, no obvious abnormalities on inspection of external nose and ears  NECK: normal movements of the head and neck  LUNGS: on inspection no signs of respiratory distress, breathing rate appears normal, no obvious gross SOB, gasping or wheezing  CV: no obvious cyanosis  MS: moves all visible extremities without noticeable abnormality  PSYCH/NEURO: pleasant and cooperative,  no obvious depression or anxiety, speech and thought processing grossly intact  ASSESSMENT AND PLAN:  Discussed the following assessment and plan:  Acute non-recurrent maxillary sinusitis  -Plenty of fluids and over-the-counter analgesics as needed -Given predictors of bacterial sinusitis above such as increasing facial pain, upper teeth  pain, recurrence/worsening of symptoms after initial improvement we will go ahead and cover with Augmentin 875 mg twice daily for 10 days -Follow-up for any persistent or worsening symptoms     I discussed the assessment and treatment plan with the patient. The patient was provided an opportunity to ask questions and all were answered. The patient agreed with the plan and demonstrated an understanding of the instructions.   The patient was advised to call back or seek an in-person evaluation if the symptoms worsen or if the condition fails to improve as anticipated.     Carolann Littler, MD

## 2021-03-18 DIAGNOSIS — R1900 Intra-abdominal and pelvic swelling, mass and lump, unspecified site: Secondary | ICD-10-CM | POA: Diagnosis not present

## 2021-03-31 ENCOUNTER — Telehealth: Payer: 59 | Admitting: Family

## 2021-03-31 DIAGNOSIS — R399 Unspecified symptoms and signs involving the genitourinary system: Secondary | ICD-10-CM

## 2021-03-31 MED ORDER — CEPHALEXIN 500 MG PO CAPS: 500.0000 mg | ORAL_CAPSULE | Freq: Two times a day (BID) | ORAL | 0 refills | Status: DC

## 2021-03-31 NOTE — Progress Notes (Signed)
Virtual Visit Consent   Ashley Mendez, you are scheduled for a virtual visit with a [facility] provider today.     Just as with appointments in the office, your consent must be obtained to participate.  Your consent will be active for this visit and any virtual visit you may have with one of our providers in the next 365 days.     If you have a MyChart account, a copy of this consent can be sent to you electronically.  All virtual visits are billed to your insurance company just like a traditional visit in the office.    As this is a virtual visit, video technology does not allow for your provider to perform a traditional examination.  This may limit your provider's ability to fully assess your condition.  If your provider identifies any concerns that need to be evaluated in person or the need to arrange testing (such as labs, EKG, etc.), we will make arrangements to do so.     Although advances in technology are sophisticated, we cannot ensure that it will always work on either your end or our end.  If the connection with a video visit is poor, the visit may have to be switched to a telephone visit.  With either a video or telephone visit, we are not always able to ensure that we have a secure connection.     I need to obtain your verbal consent now.   Are you willing to proceed with your visit today?    Arnella Pralle has provided verbal consent on 03/31/2021 for a virtual visit (video or telephone).   Evelina Dun, FNP   Date: 03/31/2021 8:20 AM   Virtual Visit via Video Note   I, Evelina Dun, connected with  Ashley Mendez  ([id number], 1981-04-04) on 03/31/21 at  8:15 AM EST by a video-enabled telemedicine application and verified that I am speaking with the correct person using two identifiers.  Location: Patient: Virtual Visit Location Patient: Home Provider: Virtual Visit Location Provider: Home Office   I discussed the limitations of evaluation and management by  telemedicine and the availability of in person appointments. The patient expressed understanding and agreed to proceed.    History of Present Illness: Ashley Mendez is a 40 y.o. who identifies as a female who was assigned female at birth, and is being seen today for UTI symptoms.  HPI: Dysuria  This is a new problem. The current episode started yesterday. The problem occurs intermittently. The problem has been gradually worsening. The quality of the pain is described as burning. The pain is at a severity of 5/10. The pain is mild. Associated symptoms include frequency, hesitancy, nausea and urgency. Pertinent negatives include no hematuria or vomiting. She has tried increased fluids for the symptoms. The treatment provided mild relief.   Problems:  Patient Active Problem List   Diagnosis Date Noted   Hypothyroid 06/18/2020   GAD (generalized anxiety disorder) 06/15/2020   PAC (premature atrial contraction) 01/26/2017   PVC (premature ventricular contraction) 01/26/2017    Allergies: No Known Allergies Medications:  Current Outpatient Medications:    cephALEXin (KEFLEX) 500 MG capsule, Take 1 capsule (500 mg total) by mouth 2 (two) times daily., Disp: 14 capsule, Rfl: 0   amoxicillin-clavulanate (AUGMENTIN) 875-125 MG tablet, Take 1 tablet by mouth 2 (two) times daily., Disp: 20 tablet, Rfl: 0   busPIRone (BUSPAR) 5 MG tablet, START TAKING 1 TABLET BY MOUTH ONCE DAILY THEN INCREASE TO TWICE A DAY AS NEEDED,  Disp: 180 tablet, Rfl: 1   levothyroxine (SYNTHROID) 25 MCG tablet, Take 1 tablet (25 mcg total) by mouth daily before breakfast. Repeat labs in 6-8 weeks., Disp: 90 tablet, Rfl: 3   Multiple Vitamins-Iron (MULTIVITAMINS WITH IRON) TABS tablet, Take by mouth., Disp: , Rfl:   Observations/Objective: Patient is well-developed, well-nourished in no acute distress.  Resting comfortably  at home.  Head is normocephalic, atraumatic.  No labored breathing.  Speech is clear and coherent with  logical content.  Patient is alert and oriented at baseline.    Assessment and Plan: 1. UTI symptoms - cephALEXin (KEFLEX) 500 MG capsule; Take 1 capsule (500 mg total) by mouth 2 (two) times daily.  Dispense: 14 capsule; Refill: 0 Force fluids AZO over the counter X2 days RTO if symptoms worsen or do not improve   Follow Up Instructions: I discussed the assessment and treatment plan with the patient. The patient was provided an opportunity to ask questions and all were answered. The patient agreed with the plan and demonstrated an understanding of the instructions.  A copy of instructions were sent to the patient via MyChart unless otherwise noted below.     The patient was advised to call back or seek an in-person evaluation if the symptoms worsen or if the condition fails to improve as anticipated.  Time:  I spent 4 minutes with the patient via telehealth technology discussing the above problems/concerns.    Evelina Dun, FNP

## 2021-04-01 DIAGNOSIS — R300 Dysuria: Secondary | ICD-10-CM | POA: Diagnosis not present

## 2021-04-02 ENCOUNTER — Telehealth: Payer: 59 | Admitting: Physician Assistant

## 2021-04-02 DIAGNOSIS — R3989 Other symptoms and signs involving the genitourinary system: Secondary | ICD-10-CM

## 2021-04-02 MED ORDER — SULFAMETHOXAZOLE-TRIMETHOPRIM 800-160 MG PO TABS: 1.0000 | ORAL_TABLET | Freq: Two times a day (BID) | ORAL | 0 refills | Status: DC

## 2021-04-02 NOTE — Patient Instructions (Signed)
Ashley Mendez, thank you for joining Mar Daring, PA-C for today's virtual visit.  While this provider is not your primary care provider (PCP), if your PCP is located in our provider database this encounter information will be shared with them immediately following your visit.  Consent: (Patient) Ashley Mendez provided verbal consent for this virtual visit at the beginning of the encounter.  Current Medications:  Current Outpatient Medications:    sulfamethoxazole-trimethoprim (BACTRIM DS) 800-160 MG tablet, Take 1 tablet by mouth 2 (two) times daily., Disp: 20 tablet, Rfl: 0   amoxicillin-clavulanate (AUGMENTIN) 875-125 MG tablet, Take 1 tablet by mouth 2 (two) times daily., Disp: 20 tablet, Rfl: 0   busPIRone (BUSPAR) 5 MG tablet, START TAKING 1 TABLET BY MOUTH ONCE DAILY THEN INCREASE TO TWICE A DAY AS NEEDED, Disp: 180 tablet, Rfl: 1   cephALEXin (KEFLEX) 500 MG capsule, Take 1 capsule (500 mg total) by mouth 2 (two) times daily., Disp: 14 capsule, Rfl: 0   levothyroxine (SYNTHROID) 25 MCG tablet, Take 1 tablet (25 mcg total) by mouth daily before breakfast. Repeat labs in 6-8 weeks., Disp: 90 tablet, Rfl: 3   Multiple Vitamins-Iron (MULTIVITAMINS WITH IRON) TABS tablet, Take by mouth., Disp: , Rfl:    Medications ordered in this encounter:  Meds ordered this encounter  Medications   sulfamethoxazole-trimethoprim (BACTRIM DS) 800-160 MG tablet    Sig: Take 1 tablet by mouth 2 (two) times daily.    Dispense:  20 tablet    Refill:  0    Order Specific Question:   Supervising Provider    Answer:   Sabra Heck, BRIAN [[id number]]     *If you need refills on other medications prior to your next appointment, please contact your pharmacy*  Follow-Up: Call back or seek an in-person evaluation if the symptoms worsen or if the condition fails to improve as anticipated.  Other Instructions Urinary Tract Infection, Adult A urinary tract infection (UTI) is an infection of any part of the  urinary tract. The urinary tract includes: The kidneys. The ureters. The bladder. The urethra. These organs make, store, and get rid of pee (urine) in the body. What are the causes? This infection is caused by germs (bacteria) in your genital area. These germs grow and cause swelling (inflammation) of your urinary tract. What increases the risk? The following factors may make you more likely to develop this condition: Using a small, thin tube (catheter) to drain pee. Not being able to control when you pee or poop (incontinence). Being female. If you are female, these things can increase the risk: Using these methods to prevent pregnancy: A medicine that kills sperm (spermicide). A device that blocks sperm (diaphragm). Having low levels of a female hormone (estrogen). Being pregnant. You are more likely to develop this condition if: You have genes that add to your risk. You are sexually active. You take antibiotic medicines. You have trouble peeing because of: A prostate that is bigger than normal, if you are female. A blockage in the part of your body that drains pee from the bladder. A kidney stone. A nerve condition that affects your bladder. Not getting enough to drink. Not peeing often enough. You have other conditions, such as: Diabetes. A weak disease-fighting system (immune system). Sickle cell disease. Gout. Injury of the spine. What are the signs or symptoms? Symptoms of this condition include: Needing to pee right away. Peeing small amounts often. Pain or burning when peeing. Blood in the pee. Pee that smells bad or  not like normal. Trouble peeing. Pee that is cloudy. Fluid coming from the vagina, if you are female. Pain in the belly or lower back. Other symptoms include: Vomiting. Not feeling hungry. Feeling mixed up (confused). This may be the first symptom in older adults. Being tired and grouchy (irritable). A fever. Watery poop (diarrhea). How is this  treated? Taking antibiotic medicine. Taking other medicines. Drinking enough water. In some cases, you may need to see a specialist. Follow these instructions at home: Medicines Take over-the-counter and prescription medicines only as told by your doctor. If you were prescribed an antibiotic medicine, take it as told by your doctor. Do not stop taking it even if you start to feel better. General instructions Make sure you: Pee until your bladder is empty. Do not hold pee for a long time. Empty your bladder after sex. Wipe from front to back after peeing or pooping if you are a female. Use each tissue one time when you wipe. Drink enough fluid to keep your pee pale yellow. Keep all follow-up visits. Contact a doctor if: You do not get better after 1-2 days. Your symptoms go away and then come back. Get help right away if: You have very bad back pain. You have very bad pain in your lower belly. You have a fever. You have chills. You feeling like you will vomit or you vomit. Summary A urinary tract infection (UTI) is an infection of any part of the urinary tract. This condition is caused by germs in your genital area. There are many risk factors for a UTI. Treatment includes antibiotic medicines. Drink enough fluid to keep your pee pale yellow. This information is not intended to replace advice given to you by your health care provider. Make sure you discuss any questions you have with your health care provider. Document Revised: 12/09/2019 Document Reviewed: 12/09/2019 Elsevier Patient Education  2022 [employer].    If you have been instructed to have an in-person evaluation today at a local Urgent Care facility, please use the link below. It will take you to a list of all of our available [facility], including address, phone number and hours of operation. Please do not delay care.  [facility]  If you or a family member do not have a primary care  provider, use the link below to schedule a visit and establish care. When you choose a [facility] primary care physician or advanced practice provider, you gain a long-term partner in health. Find a Primary Care Provider  Learn more about [facility] in-office and virtual care options: [facility]

## 2021-04-02 NOTE — Progress Notes (Signed)
Virtual Visit Consent   Phil Corti, you are scheduled for a virtual visit with a [facility] provider today.     Just as with appointments in the office, your consent must be obtained to participate.  Your consent will be active for this visit and any virtual visit you may have with one of our providers in the next 365 days.     If you have a MyChart account, a copy of this consent can be sent to you electronically.  All virtual visits are billed to your insurance company just like a traditional visit in the office.    As this is a virtual visit, video technology does not allow for your provider to perform a traditional examination.  This may limit your provider's ability to fully assess your condition.  If your provider identifies any concerns that need to be evaluated in person or the need to arrange testing (such as labs, EKG, etc.), we will make arrangements to do so.     Although advances in technology are sophisticated, we cannot ensure that it will always work on either your end or our end.  If the connection with a video visit is poor, the visit may have to be switched to a telephone visit.  With either a video or telephone visit, we are not always able to ensure that we have a secure connection.     I need to obtain your verbal consent now.   Are you willing to proceed with your visit today?    Ashley Mendez has provided verbal consent on 04/02/2021 for a virtual visit (video or telephone).   Mar Daring, PA-C   Date: 04/02/2021 7:44 AM   Virtual Visit via Video Note   I, Mar Daring, connected with  Ashley Mendez  ([id number], [birth year]) on 04/02/21 at  7:45 AM EST by a video-enabled telemedicine application and verified that I am speaking with the correct person using two identifiers.  Location: Patient: Virtual Visit Location Patient: Home Provider: Virtual Visit Location Provider: Home Office   I discussed the limitations of evaluation and management  by telemedicine and the availability of in person appointments. The patient expressed understanding and agreed to proceed.    History of Present Illness: Ashley Mendez is a 40 y.o. who identifies as a female who was assigned female at birth, and is being seen today for continued UTI symptoms. She was seen via video visit on 03/31/21 and was started on Keflex. She reports she is on day 3 of Keflex and has gotten worse. She did see her OB/GYN yesterday and has had a culture obtained but has not resulted. She continues to have pressure and burning with urination.   Problems:  Patient Active Problem List   Diagnosis Date Noted   Hypothyroid 06/18/2020   GAD (generalized anxiety disorder) 06/15/2020   PAC (premature atrial contraction) 01/26/2017   PVC (premature ventricular contraction) 01/26/2017    Allergies: No Known Allergies Medications:  Current Outpatient Medications:    sulfamethoxazole-trimethoprim (BACTRIM DS) 800-160 MG tablet, Take 1 tablet by mouth 2 (two) times daily., Disp: 20 tablet, Rfl: 0   amoxicillin-clavulanate (AUGMENTIN) 875-125 MG tablet, Take 1 tablet by mouth 2 (two) times daily., Disp: 20 tablet, Rfl: 0   busPIRone (BUSPAR) 5 MG tablet, START TAKING 1 TABLET BY MOUTH ONCE DAILY THEN INCREASE TO TWICE A DAY AS NEEDED, Disp: 180 tablet, Rfl: 1   cephALEXin (KEFLEX) 500 MG capsule, Take 1 capsule (500 mg total) by mouth 2 (  two) times daily., Disp: 14 capsule, Rfl: 0   levothyroxine (SYNTHROID) 25 MCG tablet, Take 1 tablet (25 mcg total) by mouth daily before breakfast. Repeat labs in 6-8 weeks., Disp: 90 tablet, Rfl: 3   Multiple Vitamins-Iron (MULTIVITAMINS WITH IRON) TABS tablet, Take by mouth., Disp: , Rfl:   Observations/Objective: Patient is well-developed, well-nourished in no acute distress.  Resting comfortably at home.  Head is normocephalic, atraumatic.  No labored breathing.  Speech is clear and coherent with logical content.  Patient is alert and  oriented at baseline.    Assessment and Plan: 1. Suspected UTI - sulfamethoxazole-trimethoprim (BACTRIM DS) 800-160 MG tablet; Take 1 tablet by mouth 2 (two) times daily.  Dispense: 20 tablet; Refill: 0  - Stop Cephalexin - Start Bactrim - Monitor for culture results and call OB/GYN if it is resistant to Bactrim - Push fluids - Seek in person evaluation if not improving or worsening  Follow Up Instructions: I discussed the assessment and treatment plan with the patient. The patient was provided an opportunity to ask questions and all were answered. The patient agreed with the plan and demonstrated an understanding of the instructions.  A copy of instructions were sent to the patient via MyChart unless otherwise noted below.    The patient was advised to call back or seek an in-person evaluation if the symptoms worsen or if the condition fails to improve as anticipated.  Time:  I spent 10 minutes with the patient via telehealth technology discussing the above problems/concerns.    Mar Daring, PA-C

## 2021-04-05 ENCOUNTER — Other Ambulatory Visit (HOSPITAL_COMMUNITY): Payer: Self-pay | Admitting: Obstetrics and Gynecology

## 2021-04-05 DIAGNOSIS — R1900 Intra-abdominal and pelvic swelling, mass and lump, unspecified site: Secondary | ICD-10-CM

## 2021-04-30 ENCOUNTER — Other Ambulatory Visit: Payer: Self-pay | Admitting: Adult Health

## 2021-04-30 MED ORDER — OSELTAMIVIR PHOSPHATE 75 MG PO CAPS: 75.0000 mg | ORAL_CAPSULE | Freq: Every day | ORAL | 0 refills | Status: DC

## 2021-05-03 ENCOUNTER — Other Ambulatory Visit: Payer: Self-pay

## 2021-05-03 ENCOUNTER — Ambulatory Visit (HOSPITAL_COMMUNITY)
Admission: RE | Admit: 2021-05-03 | Discharge: 2021-05-03 | Disposition: A | Discharge status: Home / Self Care | Payer: 59 | Source: Ambulatory Visit | Attending: Obstetrics and Gynecology | Admitting: Obstetrics and Gynecology

## 2021-05-03 ENCOUNTER — Telehealth: Payer: 59

## 2021-05-03 DIAGNOSIS — R1900 Intra-abdominal and pelvic swelling, mass and lump, unspecified site: Secondary | ICD-10-CM | POA: Insufficient documentation

## 2021-05-03 DIAGNOSIS — K4090 Unilateral inguinal hernia, without obstruction or gangrene, not specified as recurrent: Secondary | ICD-10-CM | POA: Diagnosis not present

## 2021-05-04 ENCOUNTER — Telehealth: Payer: 59 | Admitting: Nurse Practitioner

## 2021-06-08 ENCOUNTER — Telehealth: Payer: 59 | Admitting: Family

## 2021-06-08 DIAGNOSIS — Z20828 Contact with and (suspected) exposure to other viral communicable diseases: Secondary | ICD-10-CM

## 2021-06-08 MED ORDER — OSELTAMIVIR PHOSPHATE 75 MG PO CAPS: 75.0000 mg | ORAL_CAPSULE | Freq: Every day | ORAL | 0 refills | Status: DC

## 2021-06-08 NOTE — Progress Notes (Signed)
Virtual Visit Consent   Ashley Mendez, you are scheduled for a virtual visit with a [facility] provider today.     Just as with appointments in the office, your consent must be obtained to participate.  Your consent will be active for this visit and any virtual visit you may have with one of our providers in the next 365 days.     If you have a MyChart account, a copy of this consent can be sent to you electronically.  All virtual visits are billed to your insurance company just like a traditional visit in the office.    As this is a virtual visit, video technology does not allow for your provider to perform a traditional examination.  This may limit your provider's ability to fully assess your condition.  If your provider identifies any concerns that need to be evaluated in person or the need to arrange testing (such as labs, EKG, etc.), we will make arrangements to do so.     Although advances in technology are sophisticated, we cannot ensure that it will always work on either your end or our end.  If the connection with a video visit is poor, the visit may have to be switched to a telephone visit.  With either a video or telephone visit, we are not always able to ensure that we have a secure connection.     I need to obtain your verbal consent now.   Are you willing to proceed with your visit today?    Ashley Mendez has provided verbal consent on 06/08/2021 for a virtual visit (video or telephone).   Evelina Dun, FNP   Date: 06/08/2021 3:37 PM   Virtual Visit via Video Note   I, Evelina Dun, connected with  Ashley Mendez  ([id number], 09/29/1980) on 06/08/21 at  3:30 PM EST by a video-enabled telemedicine application and verified that I am speaking with the correct person using two identifiers.  Location: Patient: Virtual Visit Location Patient: Home Provider: Virtual Visit Location Provider: Home Office   I discussed the limitations of evaluation and management by  telemedicine and the availability of in person appointments. The patient expressed understanding and agreed to proceed.    History of Present Illness: Ashley Mendez is a 41 y.o. who identifies as a female who was assigned female at birth, and is being seen today for for exposure to flu. She reports her son was diagnosed with Flu B today. Denies any fever, SOB, headache, congestion, or cough.   HPI: HPI  Problems:  Patient Active Problem List   Diagnosis Date Noted   Hypothyroid 06/18/2020   GAD (generalized anxiety disorder) 06/15/2020   PAC (premature atrial contraction) 01/26/2017   PVC (premature ventricular contraction) 01/26/2017    Allergies: No Known Allergies Medications:  Current Outpatient Medications:    oseltamivir (TAMIFLU) 75 MG capsule, Take 1 capsule (75 mg total) by mouth daily., Disp: 10 capsule, Rfl: 0   busPIRone (BUSPAR) 5 MG tablet, START TAKING 1 TABLET BY MOUTH ONCE DAILY THEN INCREASE TO TWICE A DAY AS NEEDED, Disp: 180 tablet, Rfl: 1   levothyroxine (SYNTHROID) 25 MCG tablet, Take 1 tablet (25 mcg total) by mouth daily before breakfast. Repeat labs in 6-8 weeks., Disp: 90 tablet, Rfl: 3   Multiple Vitamins-Iron (MULTIVITAMINS WITH IRON) TABS tablet, Take by mouth., Disp: , Rfl:   Observations/Objective: Patient is well-developed, well-nourished in no acute distress.  Resting comfortably  at home.  Head is normocephalic, atraumatic.  No labored breathing.  Speech is clear and coherent with logical content.  Patient is alert and oriented at baseline.    Assessment and Plan: 1. Exposure to influenza - oseltamivir (TAMIFLU) 75 MG capsule; Take 1 capsule (75 mg total) by mouth daily.  Dispense: 10 capsule; Refill: 0  Rest Take daily, if any symptoms devolp start taking twice a day Follow up if symptoms worsen   Follow Up Instructions: I discussed the assessment and treatment plan with the patient. The patient was provided an opportunity to ask questions  and all were answered. The patient agreed with the plan and demonstrated an understanding of the instructions.  A copy of instructions were sent to the patient via MyChart unless otherwise noted below.    The patient was advised to call back or seek an in-person evaluation if the symptoms worsen or if the condition fails to improve as anticipated.  Time:  I spent 7 minutes with the patient via telehealth technology discussing the above problems/concerns.    Evelina Dun, FNP

## 2021-10-16 DIAGNOSIS — Z124 Encounter for screening for malignant neoplasm of cervix: Secondary | ICD-10-CM | POA: Diagnosis not present

## 2021-10-16 DIAGNOSIS — Z01419 Encounter for gynecological examination (general) (routine) without abnormal findings: Secondary | ICD-10-CM | POA: Diagnosis not present

## 2021-10-16 DIAGNOSIS — Z1231 Encounter for screening mammogram for malignant neoplasm of breast: Secondary | ICD-10-CM | POA: Diagnosis not present

## 2021-10-16 LAB — HM MAMMOGRAPHY

## 2021-10-18 ENCOUNTER — Other Ambulatory Visit: Payer: Self-pay | Admitting: Obstetrics and Gynecology

## 2021-10-18 DIAGNOSIS — R928 Other abnormal and inconclusive findings on diagnostic imaging of breast: Secondary | ICD-10-CM

## 2021-10-24 ENCOUNTER — Encounter: Payer: Self-pay | Admitting: Adult Health

## 2021-10-29 ENCOUNTER — Ambulatory Visit
Admission: RE | Admit: 2021-10-29 | Discharge: 2021-10-29 | Disposition: A | Discharge status: Home / Self Care | Payer: 59 | Source: Ambulatory Visit | Attending: Obstetrics and Gynecology | Admitting: Obstetrics and Gynecology

## 2021-10-29 DIAGNOSIS — N6489 Other specified disorders of breast: Secondary | ICD-10-CM | POA: Diagnosis not present

## 2021-10-29 DIAGNOSIS — R928 Other abnormal and inconclusive findings on diagnostic imaging of breast: Secondary | ICD-10-CM

## 2021-10-29 DIAGNOSIS — R922 Inconclusive mammogram: Secondary | ICD-10-CM | POA: Diagnosis not present

## 2021-11-20 DIAGNOSIS — Z135 Encounter for screening for eye and ear disorders: Secondary | ICD-10-CM | POA: Diagnosis not present

## 2021-11-20 DIAGNOSIS — H524 Presbyopia: Secondary | ICD-10-CM | POA: Diagnosis not present

## 2021-11-20 DIAGNOSIS — H52223 Regular astigmatism, bilateral: Secondary | ICD-10-CM | POA: Diagnosis not present

## 2021-11-21 ENCOUNTER — Encounter: Payer: Self-pay | Admitting: Adult Health

## 2021-11-21 ENCOUNTER — Ambulatory Visit (INDEPENDENT_AMBULATORY_CARE_PROVIDER_SITE_OTHER): Payer: 59 | Admitting: Adult Health

## 2021-11-21 VITALS — BP 102/70 | Temp 97.7°F | Ht 61.5 in | Wt 116.4 lb

## 2021-11-21 DIAGNOSIS — Z0000 Encounter for general adult medical examination without abnormal findings: Secondary | ICD-10-CM

## 2021-11-21 DIAGNOSIS — F411 Generalized anxiety disorder: Secondary | ICD-10-CM | POA: Diagnosis not present

## 2021-11-21 DIAGNOSIS — E039 Hypothyroidism, unspecified: Secondary | ICD-10-CM | POA: Diagnosis not present

## 2021-11-21 DIAGNOSIS — K582 Mixed irritable bowel syndrome: Secondary | ICD-10-CM

## 2021-11-21 LAB — CBC WITH DIFFERENTIAL/PLATELET
Basophils Absolute: 0 10*3/uL (ref 0.0–0.1)
Basophils Relative: 0.7 % (ref 0.0–3.0)
Eosinophils Absolute: 0 10*3/uL (ref 0.0–0.7)
Eosinophils Relative: 1 % (ref 0.0–5.0)
HCT: 40.1 % (ref 36.0–46.0)
Hemoglobin: 13.7 g/dL (ref 12.0–15.0)
Lymphocytes Relative: 37.5 % (ref 12.0–46.0)
Lymphs Abs: 1.7 10*3/uL (ref 0.7–4.0)
MCHC: 34.1 g/dL (ref 30.0–36.0)
MCV: 94 fl (ref 78.0–100.0)
Monocytes Absolute: 0.4 10*3/uL (ref 0.1–1.0)
Monocytes Relative: 8.5 % (ref 3.0–12.0)
Neutro Abs: 2.4 10*3/uL (ref 1.4–7.7)
Neutrophils Relative %: 52.3 % (ref 43.0–77.0)
Platelets: 179 10*3/uL (ref 150.0–400.0)
RBC: 4.27 Mil/uL (ref 3.87–5.11)
RDW: 12.9 % (ref 11.5–15.5)
WBC: 4.6 10*3/uL (ref 4.0–10.5)

## 2021-11-21 LAB — COMPREHENSIVE METABOLIC PANEL
ALT: 15 U/L (ref 0–35)
AST: 20 U/L (ref 0–37)
Albumin: 4.6 g/dL (ref 3.5–5.2)
Alkaline Phosphatase: 32 U/L — ABNORMAL LOW (ref 39–117)
BUN: 8 mg/dL (ref 6–23)
CO2: 25 mEq/L (ref 19–32)
Calcium: 9 mg/dL (ref 8.4–10.5)
Chloride: 102 mEq/L (ref 96–112)
Creatinine, Ser: 0.59 mg/dL (ref 0.40–1.20)
GFR: 111.09 mL/min (ref >60.00)
Glucose, Bld: 87 mg/dL (ref 70–99)
Potassium: 3.5 mEq/L (ref 3.5–5.1)
Sodium: 136 mEq/L (ref 135–145)
Total Bilirubin: 1.4 mg/dL — ABNORMAL HIGH (ref 0.2–1.2)
Total Protein: 7.1 g/dL (ref 6.0–8.3)

## 2021-11-21 LAB — LIPID PANEL
Cholesterol: 114 mg/dL (ref 0–200)
HDL: 63.4 mg/dL (ref >39.00)
LDL Cholesterol: 39 mg/dL (ref 0–99)
NonHDL: 51.06
Total CHOL/HDL Ratio: 2
Triglycerides: 62 mg/dL (ref 0.0–149.0)
VLDL: 12.4 mg/dL (ref 0.0–40.0)

## 2021-11-21 LAB — VITAMIN D 25 HYDROXY (VIT D DEFICIENCY, FRACTURES): VITD: 33.6 ng/mL (ref 30.00–100.00)

## 2021-11-21 LAB — T4, FREE: Free T4: 0.86 ng/dL (ref 0.60–1.60)

## 2021-11-21 LAB — TSH: TSH: 3.86 u[IU]/mL (ref 0.35–5.50)

## 2021-11-21 MED ORDER — BUSPIRONE HCL 5 MG PO TABS: ORAL_TABLET | ORAL | 1 refills | Status: DC

## 2021-11-21 MED ORDER — LEVOTHYROXINE SODIUM 25 MCG PO TABS: 25.0000 ug | ORAL_TABLET | Freq: Every day | ORAL | 3 refills | Status: DC

## 2021-11-21 NOTE — Progress Notes (Signed)
Subjective:    Patient ID: Ashley Mendez, female    DOB: 1981/02/11, 41 y.o.   MRN: [id number]  HPI Patient presents for yearly preventative medicine examination. She is a very pleasant and healthy 41 year old female who  has a past medical history of HSV (herpes simplex virus) anogenital infection, varicella, Medical history non-contributory, PAC (premature atrial contraction) (01/26/2017), Palpitation (12/10/2016), PVC (premature ventricular contraction) (01/26/2017), and Thyroid disease.  Hypothyroidism-takes Synthroid 25 mcg daily.  She feels well controlled on this medication. Lab Results  Component Value Date   TSH 2.89 07/30/2020   Anxiety-has been an issue for most of her life.  She does have a prescription for BuSpar that she takes infrequently.  She does feel well controlled currently.  IBS? - she reports that she has always had issues with her digestive system with alternating bowel habits. Last year she placed herself on a restrictive diet and noticed vast improvement not only in the discomfort but also alternating bowel habits. She would be interested in seeing a GI to discuss treatment so that she can try getting off her restricted diet.    All immunizations and health maintenance protocols were reviewed with the patient and needed orders were placed.  Appropriate screening laboratory values were ordered for the patient including screening of hyperlipidemia, renal function and hepatic function. If indicated by BPH, a PSA was ordered.  Medication reconciliation,  past medical history, social history, problem list and allergies were reviewed in detail with the patient  Goals were established with regard to weight loss, exercise, and  diet in compliance with medications. She continues to be active with exercise and eat a heart healthy diet.  Wt Readings from Last 3 Encounters:  02/08/21 115 lb (52.2 kg)  11/09/20 124 lb (56.2 kg)  10/02/20 120 lb (54.4 kg)   She is up to  date on routine mammograms and GYN care   Review of Systems  Constitutional: Negative.   HENT: Negative.    Eyes: Negative.   Respiratory: Negative.    Cardiovascular: Negative.   Gastrointestinal:  Positive for constipation and diarrhea.  Endocrine: Negative.   Genitourinary: Negative.   Musculoskeletal: Negative.   Skin: Negative.   Allergic/Immunologic: Negative.   Neurological: Negative.   Hematological: Negative.   Psychiatric/Behavioral: Negative.     Past Medical History:  Diagnosis Date   HSV (herpes simplex virus) anogenital infection    Hx of varicella    Medical history non-contributory    PAC (premature atrial contraction) 01/26/2017   Palpitation 12/10/2016   PVC (premature ventricular contraction) 01/26/2017   Thyroid disease     Social History   Socioeconomic History   Marital status: Married    Spouse name: Not on file   Number of children: Not on file   Years of education: Not on file   Highest education level: Not on file  Occupational History   Not on file  Tobacco Use   Smoking status: Never   Smokeless tobacco: Never  Substance and Sexual Activity   Alcohol use: Yes   Drug use: No   Sexual activity: Yes    Partners: Male  Other Topics Concern   Not on file  Social History Narrative   Not on file   Social Determinants of Health   Financial Resource Strain: Not on file  Food Insecurity: Not on file  Transportation Needs: Not on file  Physical Activity: Not on file  Stress: Not on file  Social Connections: Not on  file  Intimate Partner Violence: Not on file    Past Surgical History:  Procedure Laterality Date   CESAREAN SECTION N/A 12/06/2014   Procedure: CESAREAN SECTION;  Surgeon: Jerelyn Charles, MD;  Location: [facility];  Service: Obstetrics;  Laterality: N/A;   CESAREAN SECTION N/A 11/29/2015   Procedure: REPEAT CESAREAN SECTION, TWO DESSEL CORDS;  Surgeon: Jerelyn Charles, MD;  Location: [facility];  Service: Obstetrics;   Laterality: N/A;    Family History  Problem Relation Age of Onset   Hypothyroidism Mother    Atrial fibrillation Father    Von Willebrand disease Brother    Stroke Maternal Grandfather    Heart attack Paternal Grandfather     No Known Allergies  Current Outpatient Medications on File Prior to Visit  Medication Sig Dispense Refill   busPIRone (BUSPAR) 5 MG tablet START TAKING 1 TABLET BY MOUTH ONCE DAILY THEN INCREASE TO TWICE A DAY AS NEEDED 180 tablet 1   levothyroxine (SYNTHROID) 25 MCG tablet Take 1 tablet (25 mcg total) by mouth daily before breakfast. Repeat labs in 6-8 weeks. 90 tablet 3   Multiple Vitamins-Iron (MULTIVITAMINS WITH IRON) TABS tablet Take by mouth.     oseltamivir (TAMIFLU) 75 MG capsule Take 1 capsule (75 mg total) by mouth daily. 10 capsule 0   No current facility-administered medications on file prior to visit.    There were no vitals taken for this visit.      Objective:   Physical Exam Vitals and nursing note reviewed.  Constitutional:      General: She is not in acute distress.    Appearance: Normal appearance. She is well-developed. She is not ill-appearing.  HENT:     Head: Normocephalic and atraumatic.     Right Ear: Tympanic membrane, ear canal and external ear normal. There is no impacted cerumen.     Left Ear: Tympanic membrane, ear canal and external ear normal. There is no impacted cerumen.     Nose: Nose normal. No congestion or rhinorrhea.     Mouth/Throat:     Mouth: Mucous membranes are moist.     Pharynx: Oropharynx is clear. No oropharyngeal exudate or posterior oropharyngeal erythema.  Eyes:     General:        Right eye: No discharge.        Left eye: No discharge.     Extraocular Movements: Extraocular movements intact.     Conjunctiva/sclera: Conjunctivae normal.     Pupils: Pupils are equal, round, and reactive to light.  Neck:     Thyroid: No thyromegaly.     Vascular: No carotid bruit.     Trachea: No tracheal  deviation.  Cardiovascular:     Rate and Rhythm: Normal rate and regular rhythm.     Pulses: Normal pulses.     Heart sounds: Normal heart sounds. No murmur heard.    No friction rub. No gallop.  Pulmonary:     Effort: Pulmonary effort is normal. No respiratory distress.     Breath sounds: Normal breath sounds. No stridor. No wheezing, rhonchi or rales.  Chest:     Chest wall: No tenderness.  Abdominal:     General: Abdomen is flat. Bowel sounds are normal. There is no distension.     Palpations: Abdomen is soft. There is no mass.     Tenderness: There is no abdominal tenderness. There is no right CVA tenderness, left CVA tenderness, guarding or rebound.     Hernia: No hernia is  present.  Musculoskeletal:        General: No swelling, tenderness, deformity or signs of injury. Normal range of motion.     Cervical back: Normal range of motion and neck supple.     Right lower leg: No edema.     Left lower leg: No edema.  Lymphadenopathy:     Cervical: No cervical adenopathy.  Skin:    General: Skin is warm and dry.     Coloration: Skin is not jaundiced or pale.     Findings: No bruising, erythema, lesion or rash.  Neurological:     General: No focal deficit present.     Mental Status: She is alert and oriented to person, place, and time.     Cranial Nerves: No cranial nerve deficit.     Sensory: No sensory deficit.     Motor: No weakness.     Coordination: Coordination normal.     Gait: Gait normal.     Deep Tendon Reflexes: Reflexes normal.  Psychiatric:        Mood and Affect: Mood normal.        Behavior: Behavior normal.        Thought Content: Thought content normal.        Judgment: Judgment normal.       Assessment & Plan:   1. Routine general medical examination at a health care facility - Continue to stay active and eat healthy  - Follow up in one year or sooner if needed - CBC with Differential/Platelet; Future - Comprehensive metabolic panel; Future - Lipid  panel; Future - TSH; Future - TSH - Lipid panel - Comprehensive metabolic panel - CBC with Differential/Platelet  2. Acquired hypothyroidism  - CBC with Differential/Platelet; Future - Comprehensive metabolic panel; Future - Lipid panel; Future - TSH; Future - levothyroxine (SYNTHROID) 25 MCG tablet; Take 1 tablet (25 mcg total) by mouth daily before breakfast.  Dispense: 90 tablet; Refill: 3 - T4, Free; Future - T4, Free - TSH - Lipid panel - Comprehensive metabolic panel - CBC with Differential/Platelet  3. GAD (generalized anxiety disorder) - Continue Buspar as needed  4. Irritable bowel syndrome with alternating bowel habits  - Ambulatory referral to Gastroenterology  Dorothyann Peng, NP

## 2021-11-27 ENCOUNTER — Encounter: Payer: Self-pay | Admitting: Adult Health

## 2022-02-25 ENCOUNTER — Encounter: Payer: Self-pay | Admitting: Physician Assistant

## 2022-03-25 ENCOUNTER — Ambulatory Visit (HOSPITAL_COMMUNITY)
Admission: EM | Admit: 2022-03-25 | Discharge: 2022-03-25 | Disposition: A | Discharge status: Home / Self Care | Payer: 59 | Attending: Emergency Medicine | Admitting: Emergency Medicine

## 2022-03-25 ENCOUNTER — Encounter (HOSPITAL_COMMUNITY): Payer: Self-pay

## 2022-03-25 DIAGNOSIS — M5432 Sciatica, left side: Secondary | ICD-10-CM | POA: Diagnosis not present

## 2022-03-25 MED ORDER — METHOCARBAMOL 500 MG PO TABS: 500.0000 mg | ORAL_TABLET | Freq: Every evening | ORAL | 0 refills | Status: DC

## 2022-03-25 MED ORDER — DEXAMETHASONE SODIUM PHOSPHATE 10 MG/ML IJ SOLN
10.0000 mg | Freq: Once | INTRAMUSCULAR | Status: AC
  Administered 2022-03-25: 10 mg via INTRAMUSCULAR

## 2022-03-25 MED ORDER — PREDNISONE 20 MG PO TABS: 40.0000 mg | ORAL_TABLET | Freq: Every day | ORAL | 0 refills | Status: DC

## 2022-03-25 MED ORDER — DEXAMETHASONE SODIUM PHOSPHATE 10 MG/ML IJ SOLN
INTRAMUSCULAR | Status: AC
  Filled 2022-03-25: qty 1

## 2022-03-25 NOTE — ED Triage Notes (Signed)
Pt is here for back pain from a previous injury on and off . Today pt woke up with pain on the left side shooting down to ankle causing pain and discomfort

## 2022-03-25 NOTE — Discharge Instructions (Addendum)
You may start taking the prednisone tomorrow, this is a steroid,  you will take 2 tablets each morning for the next 5 days.  Robaxin has been sent to the pharmacy, this is the muscle relaxer, you will take 1 tablet before bedtime.  Please be mindful that this medication can make you sleepy, please do not operate any heavy machinery or drive a car after taking this medication.   I have attached [facility] and a sports medicine provider to your paperwork, in the case that symptoms are not improving and you need to follow-up.   I have attached some exercises that you can use in the back of your paperwork.

## 2022-03-25 NOTE — ED Provider Notes (Addendum)
[facility]    CSN: [id number] Arrival date & time: 03/25/22  4174      History   Chief Complaint Chief Complaint  Patient presents with   Back Pain    HPI Ashley Mendez is a 41 y.o. female.  Patient reports left-sided lower back pain that started this morning.  Patient reports pain is radiating down left leg.  Patient reports that she believes onset of symptoms may have began due to squatting at the gym, patient states she does a lot of heavy lifting.  Patient reports a history of chronic back pain where she had to see physical therapy in the past. Patient uses a tennis ball and back bracing to relieve symptoms of the chronic back pain.  Patient reports that these symptoms are new and different than usual.  Patient denies any incontinence of bowel or bladder.  Patient denies dysuria.  Patient has not used any medications for symptoms.    Back Pain Associated symptoms: no dysuria and no fever     Past Medical History:  Diagnosis Date   GAD (generalized anxiety disorder)    HSV (herpes simplex virus) anogenital infection    Hx of varicella    Medical history non-contributory    PAC (premature atrial contraction) 01/26/2017   Palpitation 12/10/2016   PVC (premature ventricular contraction) 01/26/2017   Thyroid disease     Patient Active Problem List   Diagnosis Date Noted   Hypothyroid 06/18/2020   GAD (generalized anxiety disorder) 06/15/2020   PAC (premature atrial contraction) 01/26/2017   PVC (premature ventricular contraction) 01/26/2017    Past Surgical History:  Procedure Laterality Date   CESAREAN SECTION N/A 12/06/2014   Procedure: CESAREAN SECTION;  Surgeon: Jerelyn Charles, MD;  Location: [facility];  Service: Obstetrics;  Laterality: N/A;   CESAREAN SECTION N/A 11/29/2015   Procedure: REPEAT CESAREAN SECTION, TWO DESSEL CORDS;  Surgeon: Jerelyn Charles, MD;  Location: [facility];  Service: Obstetrics;  Laterality: N/A;    OB History      Gravida  2   Para  2   Term  2   Preterm      AB      Living  2      SAB      IAB      Ectopic      Multiple  0   Live Births  2            Home Medications    Prior to Admission medications   Medication Sig Start Date End Date Taking? Authorizing Provider  methocarbamol (ROBAXIN) 500 MG tablet Take 1 tablet (500 mg total) by mouth at bedtime. 03/25/22  Yes Flossie Dibble, NP  predniSONE (DELTASONE) 20 MG tablet Take 2 tablets (40 mg total) by mouth daily. 03/25/22  Yes Issiah Huffaker, Fidela Juneau, NP  busPIRone (BUSPAR) 5 MG tablet START TAKING 1 TABLET BY MOUTH ONCE DAILY THEN INCREASE TO TWICE A DAY AS NEEDED 11/21/21   Nafziger, Tommi Rumps, NP  levothyroxine (SYNTHROID) 25 MCG tablet Take 1 tablet (25 mcg total) by mouth daily before breakfast. 11/21/21   Nafziger, Tommi Rumps, NP  Multiple Vitamins-Iron (MULTIVITAMINS WITH IRON) TABS tablet Take by mouth.    [provider]    Family History Family History  Problem Relation Age of Onset   Hypothyroidism Mother    Atrial fibrillation Father    Von Willebrand disease Brother    Stroke Maternal Grandfather    Heart attack Paternal Grandfather  Social History Social History   Tobacco Use   Smoking status: Never   Smokeless tobacco: Never  Substance Use Topics   Alcohol use: Yes   Drug use: No     Allergies   Patient has no known allergies.   Review of Systems Review of Systems  Constitutional:  Positive for activity change. Negative for chills and fever.  Genitourinary: Negative.  Negative for decreased urine volume, difficulty urinating, dysuria, flank pain, frequency, hematuria and urgency.  Musculoskeletal:  Positive for back pain.     Physical Exam Triage Vital Signs ED Triage Vitals [03/25/22 0827]  Enc Vitals Group     BP 102/73     Pulse Rate 65     Resp 12     Temp      Temp Source Oral     SpO2 98 %     Weight      Height      Head Circumference      Peak Flow      Pain  Score      Pain Loc      Pain Edu?      Excl. in [place]?    No data found.  Updated Vital Signs BP 102/73 (BP Location: Right Arm)   Pulse 65   Temp 98 F (36.7 C) (Oral)   Resp 12   LMP 03/07/2022   SpO2 98%   Physical Exam Vitals and nursing note reviewed.  Musculoskeletal:     Lumbar back: Spasms and tenderness present. No swelling, edema, deformity, signs of trauma, lacerations or bony tenderness. Normal range of motion. Positive left straight leg raise test. Negative right straight leg raise test. No scoliosis.  Skin:    Comments: No skin changes present to lower back.       UC Treatments / Results  Labs (all labs ordered are listed, but only abnormal results are displayed) Labs Reviewed - No data to display  EKG   Radiology No results found.  Procedures Procedures (including critical care time)  Medications Ordered in UC Medications  dexamethasone (DECADRON) injection 10 mg (10 mg Intramuscular Given 03/25/22 0908)    Initial Impression / Assessment and Plan / UC Course  I have reviewed the triage vital signs and the nursing notes.  Pertinent labs & imaging results that were available during my care of the patient were reviewed by me and considered in my medical decision making (see chart for details).     Patient was treated for sciatica of the left side . Steroid shot was given in office, patient experienced some relief after steroid shot. Prednisone and Robaxin was sent to the pharmacy. Patient was made aware of treatment regimen and possible side effects.  Patient was given orthopedic and sports medicine information for follow-up if necessary.  Patient was educated on sciatica and possible causes.  Patient was made aware that she may need to discontinue squatting due to this seeming to be a trigger of the sciatica.  Patient was given education on different exercises that she can use with sciatica rehab.  Patient verbalized understanding of  instructions. Final Clinical Impressions(s) / UC Diagnoses   Final diagnoses:  Sciatica of left side     Discharge Instructions      You may start taking the prednisone tomorrow, this is a steroid,  you will take 2 tablets each morning for the next 5 days.  Robaxin has been sent to the pharmacy, this is the muscle relaxer, you will take  1 tablet before bedtime.  Please be mindful that this medication can make you sleepy, please do not operate any heavy machinery or drive a car after taking this medication.   I have attached [facility] and a sports medicine provider to your paperwork, in the case that symptoms are not improving and you need to follow-up.   I have attached some exercises that you can use in the back of your paperwork.      ED Prescriptions     Medication Sig Dispense Auth. Provider   predniSONE (DELTASONE) 20 MG tablet Take 2 tablets (40 mg total) by mouth daily. 10 tablet Flossie Dibble, NP   methocarbamol (ROBAXIN) 500 MG tablet Take 1 tablet (500 mg total) by mouth at bedtime. 5 tablet Flossie Dibble, NP      PDMP not reviewed this encounter.   Flossie Dibble, NP 03/25/22 1026    Flossie Dibble, NP 03/25/22 1027

## 2022-03-31 ENCOUNTER — Ambulatory Visit: Payer: 59 | Admitting: Physician Assistant

## 2022-03-31 ENCOUNTER — Ambulatory Visit: Payer: 59 | Admitting: Nurse Practitioner

## 2022-04-10 DIAGNOSIS — L720 Epidermal cyst: Secondary | ICD-10-CM | POA: Diagnosis not present

## 2022-04-10 DIAGNOSIS — L821 Other seborrheic keratosis: Secondary | ICD-10-CM | POA: Diagnosis not present

## 2022-04-10 DIAGNOSIS — L738 Other specified follicular disorders: Secondary | ICD-10-CM | POA: Diagnosis not present

## 2022-04-10 DIAGNOSIS — D2272 Melanocytic nevi of left lower limb, including hip: Secondary | ICD-10-CM | POA: Diagnosis not present

## 2022-04-10 DIAGNOSIS — D2271 Melanocytic nevi of right lower limb, including hip: Secondary | ICD-10-CM | POA: Diagnosis not present

## 2022-04-10 DIAGNOSIS — D225 Melanocytic nevi of trunk: Secondary | ICD-10-CM | POA: Diagnosis not present

## 2022-04-10 DIAGNOSIS — D2262 Melanocytic nevi of left upper limb, including shoulder: Secondary | ICD-10-CM | POA: Diagnosis not present

## 2022-06-14 ENCOUNTER — Other Ambulatory Visit: Payer: Self-pay | Admitting: Adult Health

## 2022-08-27 ENCOUNTER — Telehealth: Payer: 59 | Admitting: Physician Assistant

## 2022-08-27 DIAGNOSIS — U071 COVID-19: Secondary | ICD-10-CM

## 2022-08-27 MED ORDER — NIRMATRELVIR/RITONAVIR (PAXLOVID)TABLET: 3.0000 | ORAL_TABLET | Freq: Two times a day (BID) | ORAL | 0 refills | Status: AC

## 2022-08-27 NOTE — Progress Notes (Signed)
Virtual Visit Consent   Ashley Mendez, you are scheduled for a virtual visit with a [facility] provider today. Just as with appointments in the office, your consent must be obtained to participate. Your consent will be active for this visit and any virtual visit you may have with one of our providers in the next 365 days. If you have a MyChart account, a copy of this consent can be sent to you electronically.  As this is a virtual visit, video technology does not allow for your provider to perform a traditional examination. This may limit your provider's ability to fully assess your condition. If your provider identifies any concerns that need to be evaluated in person or the need to arrange testing (such as labs, EKG, etc.), we will make arrangements to do so. Although advances in technology are sophisticated, we cannot ensure that it will always work on either your end or our end. If the connection with a video visit is poor, the visit may have to be switched to a telephone visit. With either a video or telephone visit, we are not always able to ensure that we have a secure connection.  By engaging in this virtual visit, you consent to the provision of healthcare and authorize for your insurance to be billed (if applicable) for the services provided during this visit. Depending on your insurance coverage, you may receive a charge related to this service.  I need to obtain your verbal consent now. Are you willing to proceed with your visit today? Ashley Mendez has provided verbal consent on 08/27/2022 for a virtual visit (video or telephone). Piedad Climes, [place]  Date: 08/27/2022 7:57 AM  Virtual Visit via Video Note   I, Piedad Climes, connected with  Ashley Mendez  ([id number], June 14, 1980) on 08/27/22 at  7:45 AM EDT by a video-enabled telemedicine application and verified that I am speaking with the correct person using two identifiers.  Location: Patient: Virtual Visit  Location Patient: Home Provider: Virtual Visit Location Provider: Home Office   I discussed the limitations of evaluation and management by telemedicine and the availability of in person appointments. The patient expressed understanding and agreed to proceed.    History of Present Illness: Ashley Mendez is a 42 y.o. who identifies as a female who was assigned female at birth, and is being seen today for COVID-19. Notes Husband and daughter positive as of Monday. She notes her symptoms starting last night into this morning. Noting head congestion, chest congestion and cough. Denies chest pain or SOB. Some mild stomach cramping without diarrhea. Tested positive on home test this AM.   OTC -- Dayquil.  HPI: HPI  Problems:  Patient Active Problem List   Diagnosis Date Noted   Hypothyroid 06/18/2020   GAD (generalized anxiety disorder) 06/15/2020   PAC (premature atrial contraction) 01/26/2017   PVC (premature ventricular contraction) 01/26/2017    Allergies: No Known Allergies Medications:  Current Outpatient Medications:    nirmatrelvir/ritonavir (PAXLOVID) 20 x 150 MG & 10 x  TABS, Take 3 tablets by mouth 2 (two) times daily for 5 days. (Take nirmatrelvir 150 mg two tablets twice daily for 5 days and ritonavir 100 mg one tablet twice daily for 5 days) Patient GFR is > 60, Disp: 30 tablet, Rfl: 0   busPIRone (BUSPAR) 5 MG tablet, START TAKING 1 TABLET BY MOUTH ONCE DAILY THEN INCREASE TO TWICE A DAY AS NEEDED, Disp: 180 tablet, Rfl: 1   levothyroxine (SYNTHROID) 25  MCG tablet, Take 1 tablet (25 mcg total) by mouth daily before breakfast., Disp: 90 tablet, Rfl: 3   Multiple Vitamins-Iron (MULTIVITAMINS WITH IRON) TABS tablet, Take by mouth., Disp: , Rfl:   Observations/Objective: Patient is well-developed, well-nourished in no acute distress.  Resting comfortably at home.  Head is normocephalic, atraumatic.  No labored breathing. Speech is clear and coherent with logical  content.  Patient is alert and oriented at baseline.   Assessment and Plan: 1. COVID-19 - nirmatrelvir/ritonavir (PAXLOVID) 20 x 150 MG & 10 x  TABS; Take 3 tablets by mouth 2 (two) times daily for 5 days. (Take nirmatrelvir 150 mg two tablets twice daily for 5 days and ritonavir 100 mg one tablet twice daily for 5 days) Patient GFR is > 60  Dispense: 30 tablet; Refill: 0  Discussed risks/benefits of antiviral medications including most common potential ADRs. Patient voiced understanding and would like to proceed with antiviral medication. They are candidate for Paxlovid. Rx sent to pharmacy. Supportive measures, OTC medications and vitamin regimen reviewed. Quarantine reviewed in detail. Strict ER precautions discussed with patient.    Follow Up Instructions: I discussed the assessment and treatment plan with the patient. The patient was provided an opportunity to ask questions and all were answered. The patient agreed with the plan and demonstrated an understanding of the instructions.  A copy of instructions were sent to the patient via MyChart unless otherwise noted below.   The patient was advised to call back or seek an in-person evaluation if the symptoms worsen or if the condition fails to improve as anticipated.  Time:  I spent 10 minutes with the patient via telehealth technology discussing the above problems/concerns.    Piedad Climes, PA-C

## 2022-08-27 NOTE — Patient Instructions (Signed)
Chrissie Noa, thank you for joining Piedad Climes, PA-C for today's virtual visit.  While this provider is not your primary care provider (PCP), if your PCP is located in our provider database this encounter information will be shared with them immediately following your visit.   A [facility] MyChart account gives you access to today's visit and all your visits, tests, and labs performed at [facility] " click here if you don't have a [facility] MyChart account or go to [email or web address]  Consent: (Patient) Ashley Mendez provided verbal consent for this virtual visit at the beginning of the encounter.  Current Medications:  Current Outpatient Medications:    busPIRone (BUSPAR) 5 MG tablet, START TAKING 1 TABLET BY MOUTH ONCE DAILY THEN INCREASE TO TWICE A DAY AS NEEDED, Disp: 180 tablet, Rfl: 1   levothyroxine (SYNTHROID) 25 MCG tablet, Take 1 tablet (25 mcg total) by mouth daily before breakfast., Disp: 90 tablet, Rfl: 3   methocarbamol (ROBAXIN) 500 MG tablet, Take 1 tablet (500 mg total) by mouth at bedtime., Disp: 5 tablet, Rfl: 0   Multiple Vitamins-Iron (MULTIVITAMINS WITH IRON) TABS tablet, Take by mouth., Disp: , Rfl:    predniSONE (DELTASONE) 20 MG tablet, Take 2 tablets (40 mg total) by mouth daily., Disp: 10 tablet, Rfl: 0   Medications ordered in this encounter:  No orders of the defined types were placed in this encounter.    *If you need refills on other medications prior to your next appointment, please contact your pharmacy*  Follow-Up: Call back or seek an in-person evaluation if the symptoms worsen or if the condition fails to improve as anticipated.  [facility] [phone]  Other Instructions Please keep well-hydrated and get plenty of rest. Start a saline nasal rinse to flush out your nasal passages. You can use plain Mucinex to help thin congestion. Take the Paxlovid as directed. If you have a humidifier,  running in the bedroom at night. I want you to start OTC vitamin D3 1000 units daily, vitamin C 1000 mg daily, and a zinc supplement. Please take prescribed medications as directed.  You are to isolate until making sure fever free and symptoms steadily improving for at least 24 hours. After that you need to mask for an additional 5 days.   If you note any worsening of symptoms, any significant shortness of breath or any chest pain, please seek ER evaluation ASAP.  Please do not delay care!  COVID-19: What to Do if You Are Sick If you test positive and are an older adult or someone who is at high risk of getting very sick from COVID-19, treatment may be available. Contact a healthcare provider right away after a positive test to determine if you are eligible, even if your symptoms are mild right now. You can also visit a Test to Treat location and, if eligible, receive a prescription from a provider. Don't delay: Treatment must be started within the first few days to be effective. If you have a fever, cough, or other symptoms, you might have COVID-19. Most people have mild illness and are able to recover at home. If you are sick: Keep track of your symptoms. If you have an emergency warning sign (including trouble breathing), call 911. Steps to help prevent the spread of COVID-19 if you are sick If you are sick with COVID-19 or think you might have COVID-19, follow the steps below to care for yourself and to help protect other people in  your home and community. Stay home except to get medical care Stay home. Most people with COVID-19 have mild illness and can recover at home without medical care. Do not leave your home, except to get medical care. Do not visit public areas and do not go to places where you are unable to wear a mask. Take care of yourself. Get rest and stay hydrated. Take over-the-counter medicines, such as acetaminophen, to help you feel better. Stay in touch with your doctor. Call  before you get medical care. Be sure to get care if you have trouble breathing, or have any other emergency warning signs, or if you think it is an emergency. Avoid public transportation, ride-sharing, or taxis if possible. Get tested If you have symptoms of COVID-19, get tested. While waiting for test results, stay away from others, including staying apart from those living in your household. Get tested as soon as possible after your symptoms start. Treatments may be available for people with COVID-19 who are at risk for becoming very sick. Don't delay: Treatment must be started early to be effective--some treatments must begin within 5 days of your first symptoms. Contact your healthcare provider right away if your test result is positive to determine if you are eligible. Self-tests are one of several options for testing for the virus that causes COVID-19 and may be more convenient than laboratory-based tests and point-of-care tests. Ask your healthcare provider or your local health department if you need help interpreting your test results. You can visit your state, tribal, local, and territorial health department's website to look for the latest local information on testing sites. Separate yourself from other people As much as possible, stay in a specific room and away from other people and pets in your home. If possible, you should use a separate bathroom. If you need to be around other people or animals in or outside of the home, wear a well-fitting mask. Tell your close contacts that they may have been exposed to COVID-19. An infected person can spread COVID-19 starting 48 hours (or 2 days) before the person has any symptoms or tests positive. By letting your close contacts know they may have been exposed to COVID-19, you are helping to protect everyone. See COVID-19 and Animals if you have questions about pets. If you are diagnosed with COVID-19, someone from the health department may call you.  Answer the call to slow the spread. Monitor your symptoms Symptoms of COVID-19 include fever, cough, or other symptoms. Follow care instructions from your healthcare provider and local health department. Your local health authorities may give instructions on checking your symptoms and reporting information. When to seek emergency medical attention Look for emergency warning signs* for COVID-19. If someone is showing any of these signs, seek emergency medical care immediately: Trouble breathing Persistent pain or pressure in the chest New confusion Inability to wake or stay awake Pale, gray, or blue-colored skin, lips, or nail beds, depending on skin tone *This list is not all possible symptoms. Please call your medical provider for any other symptoms that are severe or concerning to you. Call 911 or call ahead to your local emergency facility: Notify the operator that you are seeking care for someone who has or may have COVID-19. Call ahead before visiting your doctor Call ahead. Many medical visits for routine care are being postponed or done by phone or telemedicine. If you have a medical appointment that cannot be postponed, call your doctor's office, and tell them you have or  may have COVID-19. This will help the office protect themselves and other patients. If you are sick, wear a well-fitting mask You should wear a mask if you must be around other people or animals, including pets (even at home). Wear a mask with the best fit, protection, and comfort for you. You don't need to wear the mask if you are alone. If you can't put on a mask (because of trouble breathing, for example), cover your coughs and sneezes in some other way. Try to stay at least 6 feet away from other people. This will help protect the people around you. Masks should not be placed on young children under age 68 years, anyone who has trouble breathing, or anyone who is not able to remove the mask without help. Cover your  coughs and sneezes Cover your mouth and nose with a tissue when you cough or sneeze. Throw away used tissues in a lined trash can. Immediately wash your hands with soap and water for at least 20 seconds. If soap and water are not available, clean your hands with an alcohol-based hand sanitizer that contains at least 60% alcohol. Clean your hands often Wash your hands often with soap and water for at least 20 seconds. This is especially important after blowing your nose, coughing, or sneezing; going to the bathroom; and before eating or preparing food. Use hand sanitizer if soap and water are not available. Use an alcohol-based hand sanitizer with at least 60% alcohol, covering all surfaces of your hands and rubbing them together until they feel dry. Soap and water are the best option, especially if hands are visibly dirty. Avoid touching your eyes, nose, and mouth with unwashed hands. Handwashing Tips Avoid sharing personal household items Do not share dishes, drinking glasses, cups, eating utensils, towels, or bedding with other people in your home. Wash these items thoroughly after using them with soap and water or put in the dishwasher. Clean surfaces in your home regularly Clean and disinfect high-touch surfaces (for example, doorknobs, tables, handles, light switches, and countertops) in your "sick room" and bathroom. In shared spaces, you should clean and disinfect surfaces and items after each use by the person who is ill. If you are sick and cannot clean, a caregiver or other person should only clean and disinfect the area around you (such as your bedroom and bathroom) on an as needed basis. Your caregiver/other person should wait as long as possible (at least several hours) and wear a mask before entering, cleaning, and disinfecting shared spaces that you use. Clean and disinfect areas that may have blood, stool, or body fluids on them. Use household cleaners and disinfectants. Clean  visible dirty surfaces with household cleaners containing soap or detergent. Then, use a household disinfectant. Use a product from [employer] List N: Disinfectants for Coronavirus (COVID-19). Be sure to follow the instructions on the label to ensure safe and effective use of the product. Many products recommend keeping the surface wet with a disinfectant for a certain period of time (look at "contact time" on the product label). You may also need to wear personal protective equipment, such as gloves, depending on the directions on the product label. Immediately after disinfecting, wash your hands with soap and water for 20 seconds. For completed guidance on cleaning and disinfecting your home, visit Complete Disinfection Guidance. Take steps to improve ventilation at home Improve ventilation (air flow) at home to help prevent from spreading COVID-19 to other people in your household. Clear out COVID-19 virus  particles in the air by opening windows, using air filters, and turning on fans in your home. Use this interactive tool to learn how to improve air flow in your home. When you can be around others after being sick with COVID-19 Deciding when you can be around others is different for different situations. Find out when you can safely end home isolation. For any additional questions about your care, contact your healthcare provider or state or local health department. 07/31/2020 Content source: [facility] for Immunization and Respiratory Diseases ([facility]), Division of Viral Diseases This information is not intended to replace advice given to you by your health care provider. Make sure you discuss any questions you have with your health care provider. Document Revised: 09/13/2020 Document Reviewed: 09/13/2020 Elsevier Patient Education  2022 [employer].      If you have been instructed to have an in-person evaluation today at a local Urgent Care facility, please use the link below. It will  take you to a list of all of our available [facility], including address, phone number and hours of operation. Please do not delay care.  [facility]  If you or a family member do not have a primary care provider, use the link below to schedule a visit and establish care. When you choose a [facility] primary care physician or advanced practice provider, you gain a long-term partner in health. Find a Primary Care Provider  Learn more about [facility] in-office and virtual care options: [facility] Get Care Now

## 2022-10-13 DIAGNOSIS — D485 Neoplasm of uncertain behavior of skin: Secondary | ICD-10-CM | POA: Diagnosis not present

## 2022-12-03 ENCOUNTER — Ambulatory Visit (INDEPENDENT_AMBULATORY_CARE_PROVIDER_SITE_OTHER): Payer: 59 | Admitting: Adult Health

## 2022-12-03 ENCOUNTER — Encounter: Payer: Self-pay | Admitting: Adult Health

## 2022-12-03 VITALS — BP 100/60 | HR 70 | Temp 98.1°F | Ht 61.5 in | Wt 122.0 lb

## 2022-12-03 DIAGNOSIS — Z0000 Encounter for general adult medical examination without abnormal findings: Secondary | ICD-10-CM

## 2022-12-03 DIAGNOSIS — M542 Cervicalgia: Secondary | ICD-10-CM

## 2022-12-03 DIAGNOSIS — G8929 Other chronic pain: Secondary | ICD-10-CM

## 2022-12-03 DIAGNOSIS — F411 Generalized anxiety disorder: Secondary | ICD-10-CM | POA: Diagnosis not present

## 2022-12-03 DIAGNOSIS — M545 Low back pain, unspecified: Secondary | ICD-10-CM

## 2022-12-03 DIAGNOSIS — E039 Hypothyroidism, unspecified: Secondary | ICD-10-CM | POA: Diagnosis not present

## 2022-12-03 LAB — COMPREHENSIVE METABOLIC PANEL
ALT: 18 U/L (ref 0–35)
AST: 22 U/L (ref 0–37)
Albumin: 4.4 g/dL (ref 3.5–5.2)
Alkaline Phosphatase: 35 U/L — ABNORMAL LOW (ref 39–117)
BUN: 12 mg/dL (ref 6–23)
CO2: 27 mEq/L (ref 19–32)
Calcium: 9 mg/dL (ref 8.4–10.5)
Chloride: 103 mEq/L (ref 96–112)
Creatinine, Ser: 0.6 mg/dL (ref 0.40–1.20)
GFR: 109.84 mL/min (ref >60.00)
Glucose, Bld: 90 mg/dL (ref 70–99)
Potassium: 4.1 mEq/L (ref 3.5–5.1)
Sodium: 138 mEq/L (ref 135–145)
Total Bilirubin: 0.9 mg/dL (ref 0.2–1.2)
Total Protein: 6.9 g/dL (ref 6.0–8.3)

## 2022-12-03 LAB — CBC WITH DIFFERENTIAL/PLATELET
Basophils Absolute: 0 10*3/uL (ref 0.0–0.1)
Basophils Relative: 0.4 % (ref 0.0–3.0)
Eosinophils Absolute: 0 10*3/uL (ref 0.0–0.7)
Eosinophils Relative: 0.3 % (ref 0.0–5.0)
HCT: 40.3 % (ref 36.0–46.0)
Hemoglobin: 13.6 g/dL (ref 12.0–15.0)
Lymphocytes Relative: 29 % (ref 12.0–46.0)
Lymphs Abs: 1.7 10*3/uL (ref 0.7–4.0)
MCHC: 33.7 g/dL (ref 30.0–36.0)
MCV: 94.5 fl (ref 78.0–100.0)
Monocytes Absolute: 0.5 10*3/uL (ref 0.1–1.0)
Monocytes Relative: 7.6 % (ref 3.0–12.0)
Neutro Abs: 3.7 10*3/uL (ref 1.4–7.7)
Neutrophils Relative %: 62.7 % (ref 43.0–77.0)
Platelets: 217 10*3/uL (ref 150.0–400.0)
RBC: 4.27 Mil/uL (ref 3.87–5.11)
RDW: 12.6 % (ref 11.5–15.5)
WBC: 6 10*3/uL (ref 4.0–10.5)

## 2022-12-03 LAB — LIPID PANEL
Cholesterol: 122 mg/dL (ref 0–200)
HDL: 72.8 mg/dL (ref >39.00)
LDL Cholesterol: 39 mg/dL (ref 0–99)
NonHDL: 48.91
Total CHOL/HDL Ratio: 2
Triglycerides: 49 mg/dL (ref 0.0–149.0)
VLDL: 9.8 mg/dL (ref 0.0–40.0)

## 2022-12-03 LAB — T3, FREE: T3, Free: 3.4 pg/mL (ref 2.3–4.2)

## 2022-12-03 LAB — TSH: TSH: 4.45 u[IU]/mL (ref 0.35–5.50)

## 2022-12-03 LAB — T4, FREE: Free T4: 0.65 ng/dL (ref 0.60–1.60)

## 2022-12-03 LAB — VITAMIN D 25 HYDROXY (VIT D DEFICIENCY, FRACTURES): VITD: 22.52 ng/mL — ABNORMAL LOW (ref 30.00–100.00)

## 2022-12-03 MED ORDER — LEVOTHYROXINE SODIUM 25 MCG PO TABS: 25.0000 ug | ORAL_TABLET | Freq: Every day | ORAL | 3 refills | Status: DC

## 2022-12-03 NOTE — Progress Notes (Signed)
Subjective:    Patient ID: Ashley Mendez, female    DOB: 03-10-1981, 42 y.o.   MRN: [id number]  HPI Patient presents for yearly preventative medicine examination. She is a pleasant 42 year old female who  has a past medical history of GAD (generalized anxiety disorder), HSV (herpes simplex virus) anogenital infection, varicella, Medical history non-contributory, PAC (premature atrial contraction) (01/26/2017), Palpitation (12/10/2016), PVC (premature ventricular contraction) (01/26/2017), and Thyroid disease.  Hypothyroidism-takes Synthroid 25 mcg daily.  She feels well controlled on this medication. Lab Results  Component Value Date   TSH 3.86 11/21/2021   Anxiety-has been an issue for most of her life.  She does have a prescription for BuSpar that she takes infrequently.  She does feel well controlled currently but feels as though Buspar 5 mg dose is not as effective as it once was.   Right Shoulder and Right upper back/neck pain - has been on ongoing issue. Discomfort seems to be more muscular and is intermittent. She does not have any loss of ROM. She has tried massage in the past and sometimes this helps and other times it feels as though it makes it worse. Denies radiculopathy symptoms.   Left hip and left lower back pain - again, has been on ongoing issue intermittently. She was seen at urgent care 8 months ago for left sided lowe back pain. Pain will radiate down her left leg. She does have a history of chronic back pain where she has had to see PT in the past. She does do heavy lifting. No issues with bowel or bladder. At urgent care she was treated for sciatica and was given a steroid injection - she reports that this did not do much for her. She continues to have intermittent pain in her lower back and hip.     All immunizations and health maintenance protocols were reviewed with the patient and needed orders were placed.  Appropriate screening laboratory values were ordered for  the patient including screening of hyperlipidemia, renal function and hepatic function.   Medication reconciliation,  past medical history, social history, problem list and allergies were reviewed in detail with the patient  Goals were established with regard to weight loss, exercise, and  diet in compliance with medications. She eats healthy and exercises on a routine basis, she is getting back into weight lifting.   She is up to date on routine GYN care and mammograms   Review of Systems  Constitutional: Negative.   HENT: Negative.    Eyes: Negative.   Respiratory: Negative.    Cardiovascular: Negative.   Gastrointestinal: Negative.   Endocrine: Negative.   Genitourinary: Negative.   Musculoskeletal:  Positive for arthralgias, back pain, myalgias and neck pain.  Skin: Negative.   Allergic/Immunologic: Negative.   Neurological: Negative.   Hematological: Negative.   Psychiatric/Behavioral: Negative.     Past Medical History:  Diagnosis Date   GAD (generalized anxiety disorder)    HSV (herpes simplex virus) anogenital infection    Hx of varicella    Medical history non-contributory    PAC (premature atrial contraction) 01/26/2017   Palpitation 12/10/2016   PVC (premature ventricular contraction) 01/26/2017   Thyroid disease     Social History   Socioeconomic History   Marital status: Married    Spouse name: Not on file   Number of children: Not on file   Years of education: Not on file   Highest education level: Not on file  Occupational History   Not  on file  Tobacco Use   Smoking status: Never   Smokeless tobacco: Never  Substance and Sexual Activity   Alcohol use: Yes   Drug use: No   Sexual activity: Yes    Partners: Male  Other Topics Concern   Not on file  Social History Narrative   Not on file   Social Determinants of Health   Financial Resource Strain: Not on file  Food Insecurity: Not on file  Transportation Needs: Not on file  Physical  Activity: Not on file  Stress: Not on file  Social Connections: Not on file  Intimate Partner Violence: Not on file    Past Surgical History:  Procedure Laterality Date   CESAREAN SECTION N/A 12/06/2014   Procedure: CESAREAN SECTION;  Surgeon: Marlow Baars, MD;  Location: [facility];  Service: Obstetrics;  Laterality: N/A;   CESAREAN SECTION N/A 11/29/2015   Procedure: REPEAT CESAREAN SECTION, TWO DESSEL CORDS;  Surgeon: Marlow Baars, MD;  Location: [facility];  Service: Obstetrics;  Laterality: N/A;    Family History  Problem Relation Age of Onset   Hypothyroidism Mother    Atrial fibrillation Father    Von Willebrand disease Brother    Stroke Maternal Grandfather    Heart attack Paternal Grandfather     No Known Allergies  Current Outpatient Medications on File Prior to Visit  Medication Sig Dispense Refill   busPIRone (BUSPAR) 5 MG tablet START TAKING 1 TABLET BY MOUTH ONCE DAILY THEN INCREASE TO TWICE A DAY AS NEEDED 180 tablet 1   levothyroxine (SYNTHROID) 25 MCG tablet Take 1 tablet (25 mcg total) by mouth daily before breakfast. 90 tablet 3   Multiple Vitamins-Iron (MULTIVITAMINS WITH IRON) TABS tablet Take by mouth.     No current facility-administered medications on file prior to visit.    BP 100/60   Pulse 70   Temp 98.1 F (36.7 C) (Oral)   Ht 5' 1.5" (1.562 m)   Wt 122 lb (55.3 kg)   LMP 11/18/2022 (Approximate)   SpO2 99%   BMI 22.68 kg/m       Objective:   Physical Exam Vitals and nursing note reviewed.  Constitutional:      General: She is not in acute distress.    Appearance: Normal appearance. She is not ill-appearing.  HENT:     Head: Normocephalic and atraumatic.     Right Ear: Tympanic membrane, ear canal and external ear normal. There is no impacted cerumen.     Left Ear: Tympanic membrane, ear canal and external ear normal. There is no impacted cerumen.     Nose: Nose normal. No congestion or rhinorrhea.     Mouth/Throat:     Mouth:  Mucous membranes are moist.     Pharynx: Oropharynx is clear.  Eyes:     Extraocular Movements: Extraocular movements intact.     Conjunctiva/sclera: Conjunctivae normal.     Pupils: Pupils are equal, round, and reactive to light.  Neck:     Vascular: No carotid bruit.  Cardiovascular:     Rate and Rhythm: Normal rate and regular rhythm.     Pulses: Normal pulses.     Heart sounds: No murmur heard.    No friction rub. No gallop.  Pulmonary:     Effort: Pulmonary effort is normal.     Breath sounds: Normal breath sounds.  Abdominal:     General: Abdomen is flat. Bowel sounds are normal. There is no distension.     Palpations: Abdomen  is soft. There is no mass.     Tenderness: There is no abdominal tenderness. There is no guarding or rebound.     Hernia: No hernia is present.  Musculoskeletal:        General: Normal range of motion.       Arms:     Cervical back: Normal range of motion and neck supple.     Comments: Tenderness with palpation to right sided trapezius muscles   Lymphadenopathy:     Cervical: No cervical adenopathy.  Skin:    General: Skin is warm and dry.     Capillary Refill: Capillary refill takes less than 2 seconds.  Neurological:     General: No focal deficit present.     Mental Status: She is alert and oriented to person, place, and time.  Psychiatric:        Mood and Affect: Mood normal.        Behavior: Behavior normal.        Thought Content: Thought content normal.        Judgment: Judgment normal.        Assessment & Plan:  1. Routine general medical examination at a health care facility Today patient counseled on age appropriate routine health concerns for screening and prevention, each reviewed and up to date or declined. Immunizations reviewed and up to date or declined. Labs ordered and reviewed. Risk factors for depression reviewed and negative. Hearing function and visual acuity are intact. ADLs screened and addressed as needed. Functional  ability and level of safety reviewed and appropriate. Education, counseling and referrals performed based on assessed risks today. Patient provided with a copy of personalized plan for preventive services. - Continue to stay active and eat healthy  - Follow up in one year or sooner if needed   2. Acquired hypothyroidism - Continue with synthroid 25 mcg  - CBC with Differential/Platelet; Future - Comprehensive metabolic panel; Future - Lipid panel; Future - TSH; Future - T3, Free; Future - T4, Free; Future - levothyroxine (SYNTHROID) 25 MCG tablet; Take 1 tablet (25 mcg total) by mouth daily before breakfast.  Dispense: 90 tablet; Refill: 3 - VITAMIN D 25 Hydroxy (Vit-D Deficiency, Fractures); Future - VITAMIN D 25 Hydroxy (Vit-D Deficiency, Fractures) - T4, Free - T3, Free - TSH - Lipid panel - Comprehensive metabolic panel - CBC with Differential/Platelet  3GAD (generalized anxiety disorder) - Continue with Busapar. She can increase to 7.5 mg as needed - CBC with Differential/Platelet; Future - Comprehensive metabolic panel; Future - Lipid panel; Future - TSH; Future - T3, Free; Future - T4, Free; Future - VITAMIN D 25 Hydroxy (Vit-D Deficiency, Fractures); Future - VITAMIN D 25 Hydroxy (Vit-D Deficiency, Fractures) - T4, Free - T3, Free - TSH - Lipid panel - Comprehensive metabolic panel - CBC with Differential/Platelet  4. Neck pain on right side - seems to be muscular. Will hold off on xrays today and refer to PT. I think she would be a great candidate for dry needling  - Ambulatory referral to Physical Therapy  5. Chronic left-sided low back pain, unspecified whether sciatica present  - Ambulatory referral to Physical Therapy  Shirline Frees, NP

## 2022-12-13 ENCOUNTER — Other Ambulatory Visit: Payer: Self-pay | Admitting: Adult Health

## 2022-12-13 DIAGNOSIS — E039 Hypothyroidism, unspecified: Secondary | ICD-10-CM

## 2023-01-06 DIAGNOSIS — Z1231 Encounter for screening mammogram for malignant neoplasm of breast: Secondary | ICD-10-CM | POA: Diagnosis not present

## 2023-01-06 DIAGNOSIS — Z01419 Encounter for gynecological examination (general) (routine) without abnormal findings: Secondary | ICD-10-CM | POA: Diagnosis not present

## 2023-01-09 ENCOUNTER — Ambulatory Visit: Payer: 59

## 2023-01-09 ENCOUNTER — Other Ambulatory Visit: Payer: Self-pay

## 2023-01-09 DIAGNOSIS — G8929 Other chronic pain: Secondary | ICD-10-CM | POA: Insufficient documentation

## 2023-01-09 DIAGNOSIS — M545 Low back pain, unspecified: Secondary | ICD-10-CM | POA: Insufficient documentation

## 2023-01-09 DIAGNOSIS — M542 Cervicalgia: Secondary | ICD-10-CM | POA: Diagnosis not present

## 2023-01-09 DIAGNOSIS — M6281 Muscle weakness (generalized): Secondary | ICD-10-CM

## 2023-01-09 DIAGNOSIS — M5459 Other low back pain: Secondary | ICD-10-CM

## 2023-01-09 DIAGNOSIS — R262 Difficulty in walking, not elsewhere classified: Secondary | ICD-10-CM

## 2023-01-09 DIAGNOSIS — R252 Cramp and spasm: Secondary | ICD-10-CM

## 2023-01-09 NOTE — Therapy (Unsigned)
OUTPATIENT PHYSICAL THERAPY CERVICAL EVALUATION   Patient Name: Ashley Mendez MRN: [id number] DOB:02/19/1981, 42 y.o., female Today's Date: 01/11/2023  END OF SESSION:    Past Medical History:  Diagnosis Date   GAD (generalized anxiety disorder)    HSV (herpes simplex virus) anogenital infection    Hx of varicella    Medical history non-contributory    PAC (premature atrial contraction) 01/26/2017   Palpitation 12/10/2016   PVC (premature ventricular contraction) 01/26/2017   Thyroid disease    Past Surgical History:  Procedure Laterality Date   CESAREAN SECTION N/A 12/06/2014   Procedure: CESAREAN SECTION;  Surgeon: Marlow Baars, MD;  Location: [facility];  Service: Obstetrics;  Laterality: N/A;   CESAREAN SECTION N/A 11/29/2015   Procedure: REPEAT CESAREAN SECTION, TWO DESSEL CORDS;  Surgeon: Marlow Baars, MD;  Location: [facility];  Service: Obstetrics;  Laterality: N/A;   Patient Active Problem List   Diagnosis Date Noted   Hypothyroid 06/18/2020   GAD (generalized anxiety disorder) 06/15/2020   PAC (premature atrial contraction) 01/26/2017   PVC (premature ventricular contraction) 01/26/2017    PCP: Shirline Frees, NP   REFERRING PROVIDER: Shirline Frees, NP   REFERRING DIAG: M54.2 (ICD-10-CM) - Neck pain on right side M54.50,G89.29 (ICD-10-CM) - Chronic left-sided low back pain, unspecified whether sciatica present  THERAPY DIAG:  Other low back pain - Plan: PT plan of care cert/re-cert  Cervicalgia - Plan: PT plan of care cert/re-cert  Cramp and spasm - Plan: PT plan of care cert/re-cert  Difficulty in walking, not elsewhere classified - Plan: PT plan of care cert/re-cert  Muscle weakness (generalized) - Plan: PT plan of care cert/re-cert  Rationale for Evaluation and Treatment: Rehabilitation  ONSET DATE: 12/03/2022  SUBJECTIVE:                                                                                                                                                                                                          SUBJECTIVE STATEMENT: Patient reports several years of off and on neck and back pain.  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  She has two children and has to pick them up and care for them as well.  She explains that she exercises on a regular basis.  She feels like she is compensating and can't get control of the pain.  She hopes to gain control of the pain and be able to resume her prior level of function and workout routine.   Hand dominance: Right  PERTINENT HISTORY:  na  PAIN:  Are you having pain? Yes: NPRS scale: 0 neck and 6-7 low back/10 Pain location: lumbar spine and neck Pain description: aching Aggravating factors: prolonged sitting Relieving factors: medication  PRECAUTIONS: None  RED FLAGS: None     WEIGHT BEARING RESTRICTIONS: No  FALLS:  Has patient fallen in last 6 months? 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    NEXT MD VISIT: na  OBJECTIVE:   DIAGNOSTIC FINDINGS:  na  PATIENT SURVEYS:  FOTO 22, predicted 104  COGNITION: Overall cognitive status: Within functional limits for tasks assessed  SENSATION: WFL  POSTURE: No Significant postural limitations  PALPATION: Taut bands and trigger points bilateral upper traps,  tender around bilateral S.I. areas.     CERVICAL ROM:   Active ROM A/PROM (deg) eval  Flexion   Extension   Right lateral flexion  33  Left lateral flexion 40  Right rotation 42  Left rotation 45   (Blank rows = not tested)  UPPER EXTREMITY  and LE ROM:  WNL bilateral UE and LE  UPPER and LOWER EXTREMITY MMT:  Generally 5/5 on all UE MMT with exception of right shoulder ER and scaption with IR LE MMT 5/5 throughout  CERVICAL SPECIAL TESTS:  Spurling's test: Negative  FUNCTIONAL TESTS:  5 times sit to stand: 6.19 sec Timed up and go (TUG): 6.08 sec  TODAY'S TREATMENT:                                                                                                                              DATE: 01/09/23 Initial eval completed and initiated HEP   PATIENT EDUCATION:  Education details: Initiated HEP Person educated: Patient Education method: Programmer, multimedia, Facilities manager, Verbal cues, and Handouts Education comprehension: verbalized understanding, returned demonstration, and verbal cues required  HOME EXERCISE PROGRAM: Access Code: [email or web address] URL: [email or web address] Date: 01/09/2023 Prepared by: Mikey Kirschner  Exercises - Prone Shoulder Extension - Single Arm  - 2 x daily - 7 x weekly - 2 sets - 10 reps - Prone Shoulder Row  - 2 x daily - 7 x weekly - 2 sets - 10 reps - Prone Single Arm Shoulder Horizontal Abduction with Scapular Retraction and Palm Down  - 2 x daily - 7 x weekly - 2 sets - 10 reps - Sidelying Shoulder External Rotation  - 2 x daily - 7 x weekly - 2 sets - 10 reps - Single Arm Serratus Punches in Supine with Dumbbell  - 2 x daily - 7 x weekly - 2 sets - 10 reps -  Standing Hamstring Stretch on Chair  - 1 x daily - 7 x weekly - 1 sets - 3 reps - 30 sec hold - Quadriceps Stretch with Table  - 1 x daily - 7 x weekly - 1 sets - 3 reps - 30 sec hold  ASSESSMENT:  CLINICAL IMPRESSION: Patient is a 42 y.o. female who was seen today for physical therapy evaluation and treatment for cervical and lumbar spine pain.  She presents with decreased cervical ROM and weakness in the  right shoulder infraspinatus and supraspinatus.  LE strength and MMT are WNL but patient does have some tenderness in the SI area.  She also has palpable taut bands and trigger points in bilateral upper traps.  She would benefit from postural strengthening, LE flexibility, core strengthening, right shoulder strengthening, and dry needling for pain relief.    OBJECTIVE IMPAIRMENTS: decreased knowledge of condition, difficulty walking, decreased ROM, decreased strength, increased fascial restrictions, increased muscle spasms, impaired flexibility, impaired UE functional use, postural dysfunction, and pain.   ACTIVITY LIMITATIONS: carrying, lifting, bending, sitting, standing, squatting, sleeping, transfers, bed mobility, bathing, dressing, reach over head, hygiene/grooming, and caring for others  PARTICIPATION LIMITATIONS: meal prep, cleaning, laundry, driving, shopping, community activity, occupation, and yard work  PERSONAL FACTORS: Fitness are also affecting patient's functional outcome.   REHAB POTENTIAL: Good  CLINICAL DECISION MAKING: Stable/uncomplicated  EVALUATION COMPLEXITY: Low   GOALS: Goals reviewed with patient? Yes  SHORT TERM GOALS: Target date: 02/06/23  Pain report to be no greater than 4/10  Baseline:  Goal status: INITIAL  2.  Patient will be independent with initial HEP  Baseline:  Goal status: INITIAL   LONG TERM GOALS: Target date: 03/06/23  Patient to report pain no greater than 2/10  Baseline:  Goal status: INITIAL  2.  Patient to be independent with advanced HEP  Baseline:  Goal status: INITIAL  3.  Patient to be able to sit and do her job for at least 30 min without pain Baseline:  Goal status: INITIAL  4.  Patient to be able to sleep through the night  Baseline:  Goal status: INITIAL  5.  Patient to report 85% improvement in overall symptoms Baseline:  Goal status: INITIAL  6.  Patient to be able to resume her usual workouts Baseline:   Goal status: INITIAL   PLAN:  PT FREQUENCY: 1-2x/week  PT DURATION: 8 weeks  PLANNED INTERVENTIONS: Therapeutic exercises, Therapeutic activity, Neuromuscular re-education, Balance training, Gait training, Patient/Family education, Self Care, Joint mobilization, Aquatic Therapy, Dry Needling, Electrical stimulation, Spinal mobilization, Cryotherapy, Moist heat, Taping, Traction, Ultrasound, Ionotophoresis 4mg /ml Dexamethasone, Manual therapy, and Re-evaluation  PLAN FOR NEXT SESSION: Review HEP, Nustep, begin higher level core exercises   Taeshaun Rames B. Roshon Duell, PT 01/11/23 11:13 PM  [facility] [address], [address] [place], [place] [address] Phone # [phone] Fax [phone]

## 2023-01-11 ENCOUNTER — Encounter: Payer: Self-pay | Admitting: Adult Health

## 2023-01-13 MED ORDER — BUSPIRONE HCL 5 MG PO TABS: ORAL_TABLET | ORAL | 1 refills | Status: DC

## 2023-01-27 ENCOUNTER — Encounter: Payer: Self-pay | Admitting: Physical Therapy

## 2023-01-27 ENCOUNTER — Ambulatory Visit: Payer: 59 | Attending: Adult Health | Admitting: Physical Therapy

## 2023-01-27 DIAGNOSIS — R262 Difficulty in walking, not elsewhere classified: Secondary | ICD-10-CM | POA: Diagnosis not present

## 2023-01-27 DIAGNOSIS — M5459 Other low back pain: Secondary | ICD-10-CM | POA: Diagnosis not present

## 2023-01-27 DIAGNOSIS — M542 Cervicalgia: Secondary | ICD-10-CM | POA: Insufficient documentation

## 2023-01-27 DIAGNOSIS — M6281 Muscle weakness (generalized): Secondary | ICD-10-CM | POA: Diagnosis not present

## 2023-01-27 DIAGNOSIS — R252 Cramp and spasm: Secondary | ICD-10-CM | POA: Diagnosis not present

## 2023-01-27 NOTE — Therapy (Signed)
OUTPATIENT PHYSICAL THERAPY TREATMENT   Patient Name: Ashley Mendez MRN: [id number] DOB:1980-09-06, 42 y.o., female Today's Date: 01/27/2023  END OF SESSION:  PT End of Session - 01/27/23 1451     Visit Number 2    Date for PT Re-Evaluation 03/06/23    Authorization Type [place] [employer]ET[employer] SAVE    PT Start Time 1447    PT Stop Time 1530    PT Time Calculation (min) 43 min    Activity Tolerance Patient tolerated treatment well    Behavior During Therapy WFL for tasks assessed/performed              Past Medical History:  Diagnosis Date   GAD (generalized anxiety disorder)    HSV (herpes simplex virus) anogenital infection    Hx of varicella    Medical history non-contributory    PAC (premature atrial contraction) 01/26/2017   Palpitation 12/10/2016   PVC (premature ventricular contraction) 01/26/2017   Thyroid disease    Past Surgical History:  Procedure Laterality Date   CESAREAN SECTION N/A 12/06/2014   Procedure: CESAREAN SECTION;  Surgeon: Marlow Baars, MD;  Location: [facility];  Service: Obstetrics;  Laterality: N/A;   CESAREAN SECTION N/A 11/29/2015   Procedure: REPEAT CESAREAN SECTION, TWO DESSEL CORDS;  Surgeon: Marlow Baars, MD;  Location: [facility];  Service: Obstetrics;  Laterality: N/A;   Patient Active Problem List   Diagnosis Date Noted   Hypothyroid 06/18/2020   GAD (generalized anxiety disorder) 06/15/2020   PAC (premature atrial contraction) 01/26/2017   PVC (premature ventricular contraction) 01/26/2017    PCP: Shirline Frees, NP   REFERRING PROVIDER: Shirline Frees, NP   REFERRING DIAG: M54.2 (ICD-10-CM) - Neck pain on right side M54.50,G89.29 (ICD-10-CM) - Chronic left-sided low back pain, unspecified whether sciatica present  THERAPY DIAG:  Other low back pain  Cervicalgia  Cramp and spasm  Difficulty in walking, not elsewhere classified  Muscle weakness (generalized)  Rationale for Evaluation and Treatment:  Rehabilitation  ONSET DATE: 12/03/2022  SUBJECTIVE:                                                                                                                                                                                                         SUBJECTIVE STATEMENT: Pt states that she feels her pain and stiffness has improved since her evaluation. She is working on her HEP. Hand dominance: Left  PERTINENT HISTORY:  na  PAIN:  Are you having pain? Yes: NPRS scale: 0 neck and 6-7 low back/10 Pain location: lumbar spine and neck Pain  description: aching Aggravating factors: prolonged sitting Relieving factors: medication  PRECAUTIONS: None  RED FLAGS: None     WEIGHT BEARING RESTRICTIONS: No  FALLS:  Has patient fallen in last 6 months? 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    NEXT MD VISIT: na  OBJECTIVE:   DIAGNOSTIC FINDINGS:  na  PATIENT SURVEYS:  FOTO 47, predicted 36  COGNITION: Overall cognitive status: Within functional limits for tasks assessed  SENSATION: WFL  POSTURE: No Significant postural limitations  PALPATION: Taut bands and trigger points bilateral upper traps,  tender around bilateral S.I. areas.     CERVICAL ROM:   Active ROM A/PROM (deg) eval  Flexion   Extension 50 deg (+) pain mid upper T-spine  Right lateral flexion 33  Left lateral flexion 40  Right rotation 60 (+) pain mid upper T-spine  Left rotation 60 (+) pain mid upper  T-spine   (Blank rows = not tested)  UPPER EXTREMITY  and LE ROM:  WNL bilateral UE and LE  UPPER and LOWER EXTREMITY MMT:  Generally 5/5 on all UE MMT with exception of right shoulder ER and scaption with IR LE MMT 5/5 throughout  CERVICAL SPECIAL TESTS:  Spurling's test: Negative  FUNCTIONAL TESTS:  5 times sit to stand: 6.19 sec Timed up and go (TUG): 6.08 sec  TODAY'S TREATMENT:                                                                                                                              DATE:  Nustep L1 x3 min PT present to discuss progress Seated thoracic extension over foam roll x5 reps Supine thoracic extension over foam roll x5 reps Sidelying Rt shoulder ER #5 x10 reps Supine bent knee lower 90/90 x10 reps Supine bent knee dead bug x10 reps Cervical ROM measurement Trigger Point Dry-Needling  Treatment instructions: Expect mild to moderate muscle soreness. S/S of pneumothorax if dry needled over a lung field, and to seek immediate medical attention should they occur. Patient verbalized understanding of these instructions and education.  Patient Consent Given: Yes Education handout provided: Yes Muscles treated: T3-T6 multifidi and paraspinals Bil Electrical stimulation performed: No Parameters: N/A Treatment response/outcome: improved soft tissue mobility and ROM STM upper thoracic paraspinals    01/09/23 Initial eval completed and initiated HEP   PATIENT EDUCATION:  Education details: Initiated HEP Person educated: Patient Education method: Programmer, multimedia, Facilities manager, Verbal cues, and Handouts Education comprehension: verbalized understanding, returned demonstration, and verbal cues required  HOME EXERCISE PROGRAM: Access Code: [email or web address] URL: [email or web address] Date:  01/27/2023 Prepared by: [facility]  Exercises - Prone Shoulder Extension - Single Arm  - 2 x daily - 7 x weekly  - 2 sets - 10 reps - Prone Shoulder Row  - 2 x daily - 7 x weekly - 2 sets - 10 reps - Prone Single Arm Shoulder Horizontal Abduction with Scapular Retraction and Palm Down  - 2 x daily - 7 x weekly - 2 sets - 10 reps - Sidelying Shoulder External Rotation  - 2 x daily - 7 x weekly - 2 sets - 10 reps - Single Arm Serratus Punches in Supine with Dumbbell  - 2 x daily - 7 x weekly - 2 sets - 10 reps - Quadriceps Stretch with Table  - 1 x daily - 7 x weekly - 1 sets - 3 reps - 30 sec hold - Dead Bug  - 1 x daily - 7 x weekly - 3 sets - 10 reps  ASSESSMENT:  CLINICAL IMPRESSION: Pt is making steady progress towards her PT goals. Her cervical active ROM has improved by 10 deg in rotation since her evaluation. Session reviewed portions of pt's HEP and introduced core strength. Pt required initial verbal/tactile cuing to improve deep abdominal activation. HEP was updated with this. Pt was agreeable to dry needling treatment and there were multiple twitch responses elicited on the Rt thoracic multifidi and paraspinals. End of session, pt had 10 deg improvement in active cervical extension.   OBJECTIVE IMPAIRMENTS: decreased knowledge of condition, difficulty walking, decreased ROM, decreased strength, increased fascial restrictions, increased muscle spasms, impaired flexibility, impaired UE functional use, postural dysfunction, and pain.   ACTIVITY LIMITATIONS: carrying, lifting, bending, sitting, standing, squatting, sleeping, transfers, bed mobility, bathing, dressing, reach over head, hygiene/grooming, and caring for others  PARTICIPATION LIMITATIONS: meal prep, cleaning, laundry, driving, shopping, community activity, occupation, and yard work  PERSONAL FACTORS: Fitness are also affecting patient's functional outcome.   REHAB POTENTIAL: Good  CLINICAL DECISION MAKING: Stable/uncomplicated  EVALUATION COMPLEXITY: Low   GOALS: Goals reviewed with patient? Yes  SHORT TERM GOALS: Target date:  02/06/23  Pain report to be no greater than 4/10  Baseline:  Goal status: INITIAL  2.  Patient will be independent with initial HEP  Baseline:  Goal status: INITIAL   LONG TERM GOALS: Target date: 03/06/23  Patient to report pain no greater than 2/10  Baseline:  Goal status: INITIAL  2.  Patient to be independent with advanced HEP  Baseline:  Goal status: INITIAL  3.  Patient to be able to sit and do her job for at least 30 min without pain Baseline:  Goal status: INITIAL  4.  Patient to be able to sleep through the night  Baseline:  Goal status: INITIAL  5.  Patient to report 85% improvement in overall symptoms Baseline:  Goal status: INITIAL  6.  Patient to be able to resume her usual workouts Baseline:  Goal status: INITIAL   PLAN:  PT FREQUENCY: 1-2x/week  PT DURATION: 8 weeks  PLANNED INTERVENTIONS: Therapeutic exercises, Therapeutic activity, Neuromuscular re-education, Balance training, Gait training, Patient/Family education, Self Care, Joint mobilization, Aquatic Therapy, Dry Needling, Electrical stimulation, Spinal mobilization, Cryotherapy, Moist heat, Taping, Traction, Ultrasound, Ionotophoresis 4mg /ml Dexamethasone, Manual therapy, and Re-evaluation  PLAN FOR NEXT SESSION: progress core strength, deep cervical muscle strength   3:46 PM,01/27/23 Donita Brooks PT, DPT [facility]  [phone]

## 2023-02-03 ENCOUNTER — Ambulatory Visit: Payer: 59 | Admitting: Rehabilitative and Restorative Service Providers"

## 2023-02-03 ENCOUNTER — Encounter: Payer: Self-pay | Admitting: Rehabilitative and Restorative Service Providers"

## 2023-02-03 DIAGNOSIS — M5459 Other low back pain: Secondary | ICD-10-CM

## 2023-02-03 DIAGNOSIS — M6281 Muscle weakness (generalized): Secondary | ICD-10-CM

## 2023-02-03 DIAGNOSIS — R252 Cramp and spasm: Secondary | ICD-10-CM | POA: Diagnosis not present

## 2023-02-03 DIAGNOSIS — R262 Difficulty in walking, not elsewhere classified: Secondary | ICD-10-CM

## 2023-02-03 DIAGNOSIS — M542 Cervicalgia: Secondary | ICD-10-CM | POA: Diagnosis not present

## 2023-02-03 NOTE — Therapy (Addendum)
OUTPATIENT PHYSICAL THERAPY TREATMENT NOTE AND LATE ENTRY DISCHARGE SUMMARY   Patient Name: Ashley Mendez MRN: [id number] DOB:02-20-1981, 42 y.o., female Today's Date: 02/03/2023  END OF SESSION:  PT End of Session - 02/03/23 1454     Visit Number 3    Date for PT Re-Evaluation 03/06/23    Authorization Type [place] AET[employer] SAVE    PT Start Time 1447    PT Stop Time 1525    PT Time Calculation (min) 38 min    Activity Tolerance Patient tolerated treatment well    Behavior During Therapy WFL for tasks assessed/performed              Past Medical History:  Diagnosis Date   GAD (generalized anxiety disorder)    HSV (herpes simplex virus) anogenital infection    Hx of varicella    Medical history non-contributory    PAC (premature atrial contraction) 01/26/2017   Palpitation 12/10/2016   PVC (premature ventricular contraction) 01/26/2017   Thyroid disease    Past Surgical History:  Procedure Laterality Date   CESAREAN SECTION N/A 12/06/2014   Procedure: CESAREAN SECTION;  Surgeon: Marlow Baars, MD;  Location: [facility];  Service: Obstetrics;  Laterality: N/A;   CESAREAN SECTION N/A 11/29/2015   Procedure: REPEAT CESAREAN SECTION, TWO DESSEL CORDS;  Surgeon: Marlow Baars, MD;  Location: [facility];  Service: Obstetrics;  Laterality: N/A;   Patient Active Problem List   Diagnosis Date Noted   Hypothyroid 06/18/2020   GAD (generalized anxiety disorder) 06/15/2020   PAC (premature atrial contraction) 01/26/2017   PVC (premature ventricular contraction) 01/26/2017    PCP: Shirline Frees, NP   REFERRING PROVIDER: Shirline Frees, NP   REFERRING DIAG: M54.2 (ICD-10-CM) - Neck pain on right side M54.50,G89.29 (ICD-10-CM) - Chronic left-sided low back pain, unspecified whether sciatica present  THERAPY DIAG:  Other low back pain  Cervicalgia  Cramp and spasm  Difficulty in walking, not elsewhere classified  Muscle weakness (generalized)  Rationale for  Evaluation and Treatment: Rehabilitation  ONSET DATE: 12/03/2022  SUBJECTIVE:                                                                                                                                                                                                         SUBJECTIVE STATEMENT: Pt states that she can tell that PT is helping.  Pt reports feeling at least 80% better since starting PT. Hand dominance: Left  PERTINENT HISTORY:  na  PAIN:  Are you having pain? Yes: NPRS scale: 4/10 neck and 1-2 low back/10  Pain location: lumbar spine and neck Pain description: aching Aggravating factors: prolonged sitting Relieving factors: medication  PRECAUTIONS: None  RED FLAGS: None     WEIGHT BEARING RESTRICTIONS: No  FALLS:  Has patient fallen in last 6 months? 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    NEXT MD VISIT: na  OBJECTIVE:   DIAGNOSTIC FINDINGS:  na  PATIENT SURVEYS:  Eval:  FOTO 55, predicted 66 01/12/2023:  FOTO 71 (goal met)   COGNITION: Overall cognitive status: Within functional limits for tasks assessed  SENSATION: WFL  POSTURE: No Significant postural limitations  PALPATION: Taut bands and trigger points bilateral upper traps,  tender around bilateral S.I. areas.     CERVICAL ROM:   Active ROM A/PROM (deg) eval  Flexion   Extension 50 deg (+) pain mid upper T-spine  Right lateral flexion 33  Left lateral flexion 40  Right rotation 60 (+)  pain mid upper T-spine  Left rotation 60 (+) pain mid upper T-spine   (Blank rows = not tested)  UPPER EXTREMITY  and LE ROM:  WNL bilateral UE and LE  UPPER and LOWER EXTREMITY MMT:  Generally 5/5 on all UE MMT with exception of right shoulder ER and scaption with IR LE MMT 5/5 throughout  CERVICAL SPECIAL TESTS:  Spurling's test: Negative  FUNCTIONAL TESTS:  5 times sit to stand: 6.19 sec Timed up and go (TUG): 6.08 sec  TODAY'S TREATMENT:                                                                                                                               DATE: 02/03/2023 Nustep level 5 x6 min with PT present to discuss status Standing lat pull 40# x10, then 55# x10 Seated rows 40# 2x10 Standing 3 way scapular stabilization with blue loop 2x5 Sitting on green pball:  4 way pelvic tilt, CW and CCW circles x20 each Seated on green pball:  marching and LAQ 2x10 each bilat Single leg stance with alternate UE tapping cone on chair 2x10 bilat Standing dead lift with 25# kettlebell 2x10 Diagonal chops 10# cable pulley x10 bilat Review of new HEP provided with 3 reps of shoulder D2 and shoulder horizontal abduction   DATE: 01/27/2023 Nustep L1 x3 min PT present to discuss progress Seated thoracic extension over foam roll x5 reps Supine thoracic extension over foam roll x5 reps Sidelying Rt shoulder ER #5 x10 reps Supine bent knee lower 90/90 x10 reps Supine bent knee dead bug x10 reps Cervical ROM measurement Trigger Point Dry-Needling  Treatment instructions: Expect mild to moderate muscle soreness. S/S of pneumothorax if dry needled over a lung field, and to seek immediate medical attention should they occur. Patient verbalized understanding of these instructions and education.  Patient Consent Given: Yes Education handout provided: Yes Muscles treated: T3-T6 multifidi and paraspinals Bil Electrical stimulation performed: No Parameters: N/A Treatment  response/outcome: improved soft tissue mobility and ROM STM upper thoracic paraspinals      PATIENT EDUCATION:  Education details: Initiated HEP Person educated: Patient Education method: Programmer, multimedia, Facilities manager, Verbal cues, and Handouts Education comprehension: verbalized understanding, returned demonstration, and verbal cues required  HOME EXERCISE PROGRAM: Access Code: [email or web address] URL: [email or web address] Date: 02/03/2023 Prepared by: Clydie Braun Neilan Rizzo  Exercises - Prone Shoulder Extension - Single Arm  - 2 x daily - 7 x weekly - 2 sets - 10 reps - Prone Shoulder Row  - 2 x daily - 7 x weekly - 2 sets - 10 reps - Prone Single Arm Shoulder Horizontal Abduction with Scapular Retraction and Palm Down  - 2 x daily - 7 x weekly - 2 sets - 10 reps - Sidelying Shoulder External Rotation  - 2 x daily - 7 x weekly - 2 sets - 10 reps - Single Arm Serratus Punches in Supine with Dumbbell  - 2 x daily - 7 x weekly - 2 sets - 10 reps - Quadriceps Stretch with Table  - 1 x daily - 7 x weekly - 1 sets - 3 reps - 30 sec hold - Dead Bug  - 1 x daily - 7 x weekly - 3 sets - 10 reps - Shoulder External Rotation and Scapular Retraction with Resistance  - 1 x daily - 7 x weekly - 2 sets - 10 reps - Standing Shoulder Horizontal Abduction with Resistance  - 1 x daily - 7 x weekly - 2 sets - 10 reps - Standing Shoulder Single Arm PNF D2 Flexion with Resistance  - 1 x daily - 7 x weekly - 2 sets - 10 reps  ASSESSMENT:  CLINICAL IMPRESSION: Kemiah presents to skilled PT reporting that overall, she is feeling 80% better and thinks that she wants to start spreading out her visits more secondary to her improvement.  Patient states that dry needling seemed to help last visit.  Patient able to progress with weight machines during visit without increased pain.  Patient with improved pelvic mobility noted with subsequent pelvic tilts and circles on physioball.  Patient has met anticipated FOTO score  and able to discontinue that in online database.  Patient provided with updated HEP for postural and shoulder strengthening.  Patient to return in 2 weeks for next visit and will perform HEP in the meantime.  Patient progressing well and may be able to have discharge earlier than anticipated if she continues trajectory.  OBJECTIVE IMPAIRMENTS: decreased knowledge of condition, difficulty walking, decreased ROM, decreased strength, increased fascial restrictions, increased muscle spasms, impaired flexibility, impaired UE functional use, postural dysfunction, and pain.   ACTIVITY LIMITATIONS: carrying, lifting, bending, sitting, standing, squatting, sleeping, transfers, bed mobility, bathing, dressing, reach over head, hygiene/grooming, and caring for others  PARTICIPATION LIMITATIONS: meal prep, cleaning, laundry, driving, shopping, community activity, occupation, and yard work  PERSONAL FACTORS: Fitness are also affecting patient's functional outcome.   REHAB POTENTIAL: Good  CLINICAL DECISION MAKING: Stable/uncomplicated  EVALUATION COMPLEXITY: Low   GOALS: Goals reviewed with patient? Yes  SHORT TERM GOALS: Target date: 02/06/23  Pain report to be no greater than 4/10  Baseline:  Goal status: MET on 02/03/2023  2.  Patient will be independent with initial HEP  Baseline:  Goal status: MET on 02/03/2023   LONG TERM GOALS: Target date: 03/06/23  Patient to report pain no greater than 2/10  Baseline:  Goal status: Ongoing  2.  Patient to be independent with advanced HEP  Baseline:  Goal status: Ongoing  3.  Patient to be able to sit and do her job for at least 30 min without pain Baseline:  Goal status: Ongoing  4.  Patient to be able to sleep through the night  Baseline:  Goal status: Ongoing  5.  Patient to report 85% improvement in overall symptoms Baseline: (80% improvement reported on 02/03/23) Goal status: Ongoing   6.  Patient to be able to resume her usual  workouts Baseline:  Goal status: Ongoing   PLAN:  PT FREQUENCY: 1-2x/week  PT DURATION: 8 weeks  PLANNED INTERVENTIONS: Therapeutic exercises, Therapeutic activity, Neuromuscular re-education, Balance training, Gait training, Patient/Family education, Self Care, Joint mobilization, Aquatic Therapy, Dry Needling, Electrical stimulation, Spinal mobilization, Cryotherapy, Moist heat, Taping, Traction, Ultrasound, Ionotophoresis 4mg /ml Dexamethasone, Manual therapy, and Re-evaluation  PLAN FOR NEXT SESSION: progress core strength, deep cervical muscle strength, manual/dry needling as indicated    Reather Laurence, PT, DPT 02/03/23, 3:39 PM  [facility] [address] [place], [place] [address] Phone # [phone] Fax [phone]    PHYSICAL THERAPY DISCHARGE SUMMARY  As of 06/15/23, patient has not returned for further visits and discharged from PT at this time.  Please see above for most recent PT update.  Patient agrees to discharge. Patient goals were partially met. Patient is being discharged due to not returning since the last visit.  Clydie Braun Lynton Crescenzo, PT, DPT 06/15/23, 10:31 AM

## 2023-02-10 ENCOUNTER — Encounter: Payer: 59 | Admitting: Physical Therapy

## 2023-05-28 ENCOUNTER — Encounter: Payer: Self-pay | Admitting: Gastroenterology

## 2023-06-23 NOTE — Progress Notes (Unsigned)
Chief Complaint:*** Primary GI Doctor:unassigned  HPI:  Patient is a  43  year old female patient with past medical history of anxiety and hypothyroidism who presents with complaint of ***     Interval History  Patient admits/denies GERD Patient admits/denies dysphagia Patient admits/denies nausea, vomiting, or weight loss  Patient admits/denies altered bowel habits Patient admits/denies abdominal pain Patient admits/denies rectal bleeding   Denies/Admits alcohol Denies/Admits smoking Denies/Admits NSAID use. Denies/Admits they are on blood thinners.  Patients last colonoscopy Patients last EGD  Patient's family history includes  Wt Readings from Last 3 Encounters:  12/03/22 122 lb (55.3 kg)  11/21/21 116 lb 6.4 oz (52.8 kg)  02/08/21 115 lb (52.2 kg)      Past Medical History:  Diagnosis Date   GAD (generalized anxiety disorder)    HSV (herpes simplex virus) anogenital infection    Hx of varicella    Medical history non-contributory    PAC (premature atrial contraction) 01/26/2017   Palpitation 12/10/2016   PVC (premature ventricular contraction) 01/26/2017   Thyroid disease     Past Surgical History:  Procedure Laterality Date   CESAREAN SECTION N/A 12/06/2014   Procedure: CESAREAN SECTION;  Surgeon: Marlow Baars, MD;  Location: [facility];  Service: Obstetrics;  Laterality: N/A;   CESAREAN SECTION N/A 11/29/2015   Procedure: REPEAT CESAREAN SECTION, TWO DESSEL CORDS;  Surgeon: Marlow Baars, MD;  Location: [facility];  Service: Obstetrics;  Laterality: N/A;    Current Outpatient Medications  Medication Sig Dispense Refill   busPIRone (BUSPAR) 5 MG tablet START TAKING 1 TABLET BY MOUTH ONCE DAILY THEN INCREASE TO TWICE A DAY AS NEEDED 180 tablet 1   levothyroxine (SYNTHROID) 25 MCG tablet TAKE 1 TABLET BY MOUTH DAILY BEFORE BREAKFAST. 30 tablet 11   No current facility-administered medications for this visit.    Allergies as of 06/24/2023   (No  Known Allergies)    Family History  Problem Relation Age of Onset   Hypothyroidism Mother    Atrial fibrillation Father    Von Willebrand disease Brother    Stroke Maternal Grandfather    Heart attack Paternal Grandfather     Review of Systems:    Constitutional: No weight loss, fever, chills, weakness or fatigue HEENT: Eyes: No change in vision               Ears, Nose, Throat:  No change in hearing or congestion Skin: No rash or itching Cardiovascular: No chest pain, chest pressure or palpitations   Respiratory: No SOB or cough Gastrointestinal: See HPI and otherwise negative Genitourinary: No dysuria or change in urinary frequency Neurological: No headache, dizziness or syncope Musculoskeletal: No new muscle or joint pain Hematologic: No bleeding or bruising Psychiatric: No history of depression or anxiety    Physical Exam:  Vital signs: There were no vitals taken for this visit.  Constitutional:   Pleasant Caucasian female*** appears to be in NAD, Well developed, Well nourished, alert and cooperative Head:  Normocephalic and atraumatic. Eyes:   PEERL, EOMI. No icterus. Conjunctiva pink. Ears:  Normal auditory acuity. Neck:  Supple Throat: Oral cavity and pharynx without inflammation, swelling or lesion.  Respiratory: Respirations even and unlabored. Lungs clear to auscultation bilaterally.   No wheezes, crackles, or rhonchi.  Cardiovascular: Normal S1, S2. Regular rate and rhythm. No peripheral edema, cyanosis or pallor.  Gastrointestinal:  Soft, nondistended, nontender. No rebound or guarding. Normal bowel sounds. No appreciable masses or hepatomegaly. Rectal:  Not performed.  Anoscopy:  Msk:  Symmetrical without gross deformities. Without edema, no deformity or joint abnormality.  Neurologic:  Alert and  oriented x4;  grossly normal neurologically.  Skin:   Dry and intact without significant lesions or rashes. Psychiatric: Oriented to person, place and time.  Demonstrates good judgement and reason without abnormal affect or behaviors.  RELEVANT LABS AND IMAGING: CBC    Latest Ref Rng & Units 12/03/2022    8:28 AM 11/21/2021    8:00 AM 10/02/2020    6:42 PM  CBC  WBC 4.0 - 10.5 K/uL 6.0  4.6  4.8   Hemoglobin 12.0 - 15.0 g/dL 40.9  81.1  91.4   Hematocrit 36.0 - 46.0 % 40.3  40.1  39.8   Platelets 150.0 - 400.0 K/uL 217.0  179.0  205      CMP     Latest Ref Rng & Units 12/03/2022    8:28 AM 11/21/2021    8:00 AM 10/02/2020    6:42 PM  CMP  Glucose 70 - 99 mg/dL 90  87  98   BUN 6 - 23 mg/dL 12  8  9    Creatinine 0.40 - 1.20 mg/dL 7.82  9.56  2.13   Sodium 135 - 145 mEq/L 138  136  139   Potassium 3.5 - 5.1 mEq/L 4.1  3.5  3.5   Chloride 96 - 112 mEq/L 103  102  103   CO2 19 - 32 mEq/L 27  25  28    Calcium 8.4 - 10.5 mg/dL 9.0  9.0  9.4   Total Protein 6.0 - 8.3 g/dL 6.9  7.1  7.4   Total Bilirubin 0.2 - 1.2 mg/dL 0.9  1.4  0.6   Alkaline Phos 39 - 117 U/L 35  32  31   AST 0 - 37 U/L 22  20  18    ALT 0 - 35 U/L 18  15  13       Lab Results  Component Value Date   TSH 4.45 12/03/2022     Assessment: 1. ***  Plan: 1. ***   Thank you for the courtesy of this consult. Please call me with any questions or concerns.   Sofiya Ezelle, FNP-C [facility] 06/23/2023, 2:50 PM  Cc: Shirline Frees, NP

## 2023-06-24 ENCOUNTER — Encounter: Payer: Self-pay | Admitting: Gastroenterology

## 2023-06-24 ENCOUNTER — Ambulatory Visit (INDEPENDENT_AMBULATORY_CARE_PROVIDER_SITE_OTHER): Payer: 59 | Admitting: Gastroenterology

## 2023-06-24 VITALS — BP 112/80 | HR 82 | Ht 61.5 in | Wt 123.4 lb

## 2023-06-24 DIAGNOSIS — R151 Fecal smearing: Secondary | ICD-10-CM

## 2023-06-24 DIAGNOSIS — R194 Change in bowel habit: Secondary | ICD-10-CM | POA: Diagnosis not present

## 2023-06-24 DIAGNOSIS — R1031 Right lower quadrant pain: Secondary | ICD-10-CM | POA: Diagnosis not present

## 2023-06-24 DIAGNOSIS — R143 Flatulence: Secondary | ICD-10-CM | POA: Diagnosis not present

## 2023-06-24 DIAGNOSIS — R140 Abdominal distension (gaseous): Secondary | ICD-10-CM | POA: Diagnosis not present

## 2023-06-24 DIAGNOSIS — R110 Nausea: Secondary | ICD-10-CM

## 2023-06-24 DIAGNOSIS — R1030 Lower abdominal pain, unspecified: Secondary | ICD-10-CM

## 2023-06-24 MED ORDER — SUFLAVE 178.7 G PO SOLR: 1.0000 | Freq: Once | ORAL | 0 refills | Status: AC

## 2023-06-24 NOTE — Patient Instructions (Addendum)
Please purchase the following medications over the counter and take as directed:  Miralax 1 capful twice daily  We have given you samples of the following medication to take:  IBGard  You will be contacted by [facility] Scheduling in the next 2 days to arrange a CT scan.  The number on your caller ID will be [phone], please answer when they call.  If you have not heard from them in 2 days please call [phone] to schedule.     You have been scheduled for a colonoscopy. Please follow written instructions given to you at your visit today.   If you use inhalers (even only as needed), please bring them with you on the day of your procedure.  DO NOT TAKE 7 DAYS PRIOR TO TEST- Trulicity (dulaglutide) Ozempic, Wegovy (semaglutide) Mounjaro (tirzepatide) Bydureon Bcise (exanatide extended release)  DO NOT TAKE 1 DAY PRIOR TO YOUR TEST Rybelsus (semaglutide) Adlyxin (lixisenatide) Victoza (liraglutide) Byetta (exanatide) ___________________________________________________________________________  Ashley Mendez will receive your bowel preparation through [facility], which ensures the lowest copay and home delivery, with outreach via text or call from an [phone] number. Please respond promptly to avoid rescheduling of your procedure. If you are interested in alternative options or have any questions regarding your prep, please contact them at [phone] ____________________________________________________________________________  Your Provider Has Sent Your Bowel Prep Regimen To [facility]   [facility] will contact you to verify your information and collect your copay, if applicable. Enjoy the comfort of your home while your prescription is mailed to you, FREE of any shipping charges.   [facility] accepts all major insurance benefits and applies discounts & coupons.  Have additional questions?   Chat: [email or web address] Call: [phone] Email:  [email or web address] NCPDP: [id number]  How will [facility] contact you?  With a Welcome phone call,  a Welcome text and a checkout link in text form.  Texts you receive from [phone] Are NOT Spam.  *To set up delivery, you must complete the checkout process via link or speak to one of the patient care representatives. If [facility] is unable to reach you, your prescription may be delayed.  To avoid long hold times on the phone, you may also utilize the secure chat feature on the [facility] website to request that they call you back for transaction completion or to expedite your concerns.  Due to recent changes in healthcare laws, you may see the results of your imaging and laboratory studies on MyChart before your provider has had a chance to review them.  We understand that in some cases there may be results that are confusing or concerning to you. Not all laboratory results come back in the same time frame and the provider may be waiting for multiple results in order to interpret others.  Please give [place] 48 hours in order for your provider to thoroughly review all the results before contacting the office for clarification of your results.   _______________________________________________________  If your blood pressure at your visit was 140/90 or greater, please contact your primary care physician to follow up on this.  _______________________________________________________  If you are age 44 or older, your body mass index should be between 23-30. Your Body mass index is 22.95 kg/m. If this is out of the aforementioned range listed, please consider follow up with your Primary Care Provider.  If you are age 25 or younger, your body mass index should be between 19-25. Your Body mass index is 22.95 kg/m. If this is out of the aformentioned range listed,  please consider follow up with your Primary Care Provider.   ________________________________________________________  The  [facility] providers would like to encourage you to use [facility] to communicate with providers for non-urgent requests or questions.  Due to long hold times on the telephone, sending your provider a message by [facility] may be a faster and more efficient way to get a response.  Please allow 48 business hours for a response.  Please remember that this is for non-urgent requests.  _______________________________________________________ Thank you for trusting me with your gastrointestinal care!   Deanna May, NP

## 2023-06-25 NOTE — Progress Notes (Signed)
I agree with the assessment and plan as outlined by Ms. May.

## 2023-07-15 ENCOUNTER — Encounter: Payer: Self-pay | Admitting: Internal Medicine

## 2023-07-23 ENCOUNTER — Encounter: Payer: Self-pay | Admitting: Internal Medicine

## 2023-07-23 ENCOUNTER — Ambulatory Visit (AMBULATORY_SURGERY_CENTER): Payer: 59 | Admitting: Internal Medicine

## 2023-07-23 VITALS — BP 82/48 | HR 63 | Temp 98.5°F | Resp 8 | Ht 61.0 in | Wt 123.0 lb

## 2023-07-23 DIAGNOSIS — D123 Benign neoplasm of transverse colon: Secondary | ICD-10-CM

## 2023-07-23 DIAGNOSIS — I493 Ventricular premature depolarization: Secondary | ICD-10-CM | POA: Diagnosis not present

## 2023-07-23 DIAGNOSIS — D122 Benign neoplasm of ascending colon: Secondary | ICD-10-CM | POA: Diagnosis not present

## 2023-07-23 DIAGNOSIS — K648 Other hemorrhoids: Secondary | ICD-10-CM

## 2023-07-23 DIAGNOSIS — R194 Change in bowel habit: Secondary | ICD-10-CM

## 2023-07-23 DIAGNOSIS — F411 Generalized anxiety disorder: Secondary | ICD-10-CM | POA: Diagnosis not present

## 2023-07-23 MED ORDER — SODIUM CHLORIDE 0.9 % IV SOLN: 500.0000 mL | Freq: Once | INTRAVENOUS | Status: DC

## 2023-07-23 NOTE — Op Note (Signed)
 [facility] Patient Name: Ashley Mendez Procedure Date: 07/23/2023 4:12 PM MRN: [id number] Endoscopist: Particia Lather , , [id number] Age: 43 Referring MD:  Date of Birth: [birth year] Gender: Female Account #: [id number] Procedure:                Colonoscopy Indications:              Change in bowel habits Medicines:                Monitored Anesthesia Care Procedure:                Pre-Anesthesia Assessment:                           - Prior to the procedure, a History and Physical                            was performed, and patient medications and                            allergies were reviewed. The patient's tolerance of                            previous anesthesia was also reviewed. The risks                            and benefits of the procedure and the sedation                            options and risks were discussed with the patient.                            All questions were answered, and informed consent                            was obtained. Prior Anticoagulants: The patient has                            taken no anticoagulant or antiplatelet agents. ASA                            Grade Assessment: II - A patient with mild systemic                            disease. After reviewing the risks and benefits,                            the patient was deemed in satisfactory condition to                            undergo the procedure.                           After obtaining informed consent, the colonoscope  was passed under direct vision. Throughout the                            procedure, the patient's blood pressure, pulse, and                            oxygen saturations were monitored continuously. The                            CF [id number] #[id number] was introduced through the anus                            and advanced to the the terminal ileum. The                            colonoscopy was performed without  difficulty. The                            patient tolerated the procedure well. The quality                            of the bowel preparation was good. The terminal                            ileum, ileocecal valve, appendiceal orifice, and                            rectum were photographed. Scope In: 4:37:15 PM Scope Out: 4:52:27 PM Scope Withdrawal Time: 0 hours 10 minutes 39 seconds  Total Procedure Duration: 0 hours 15 minutes 12 seconds  Findings:                 The terminal ileum appeared normal.                           Two sessile polyps were found in the transverse                            colon and ascending colon. The polyps were 3 to 5                            mm in size. These polyps were removed with a cold                            snare. Resection and retrieval were complete.                           Non-bleeding internal hemorrhoids were found during                            retroflexion. Complications:            No immediate complications. Estimated Blood Loss:     Estimated blood loss was minimal. Impression:               -  The examined portion of the ileum was normal.                           - Two 3 to 5 mm polyps in the transverse colon and                            in the ascending colon, removed with a cold snare.                            Resected and retrieved.                           - Non-bleeding internal hemorrhoids. Recommendation:           - Discharge patient to home (with escort).                           - Await pathology results.                           - The findings and recommendations were discussed                            with the patient. Dr Particia Lather "Alan Ripper" Leonides Schanz,  07/23/2023 4:59:13 PM

## 2023-07-23 NOTE — Progress Notes (Unsigned)
 Called to room to assist during endoscopic procedure.  Patient ID and intended procedure confirmed with present staff. Received instructions for my participation in the procedure from the performing physician.

## 2023-07-23 NOTE — Progress Notes (Unsigned)
 Vss nad trans to pacu

## 2023-07-23 NOTE — Progress Notes (Unsigned)
 GASTROENTEROLOGY PROCEDURE H&P NOTE   Primary Care Physician: Shirline Frees, NP    Reason for Procedure:   Change in bowel habits  Plan:    Colonoscopy  Patient is appropriate for endoscopic procedure(s) in the ambulatory ([facility]) setting.  The nature of the procedure, as well as the risks, benefits, and alternatives were carefully and thoroughly reviewed with the patient. Ample time for discussion and questions allowed. The patient understood, was satisfied, and agreed to proceed.     HPI: Ashley Mendez is a 43 y.o. female who presents for colonoscopy for evaluation of change in bowel habits .  Patient was most recently seen in the Gastroenterology Clinic on 06/24/23.  No interval change in medical history since that appointment. Please refer to that note for full details regarding GI history and clinical presentation.   Past Medical History:  Diagnosis Date   GAD (generalized anxiety disorder)    HSV (herpes simplex virus) anogenital infection    Hx of varicella    Medical history non-contributory    PAC (premature atrial contraction) 01/26/2017   Palpitation 12/10/2016   PVC (premature ventricular contraction) 01/26/2017   Thyroid disease     Past Surgical History:  Procedure Laterality Date   CESAREAN SECTION N/A 12/06/2014   Procedure: CESAREAN SECTION;  Surgeon: Marlow Baars, MD;  Location: [facility];  Service: Obstetrics;  Laterality: N/A;   CESAREAN SECTION N/A 11/29/2015   Procedure: REPEAT CESAREAN SECTION, TWO DESSEL CORDS;  Surgeon: Marlow Baars, MD;  Location: [facility];  Service: Obstetrics;  Laterality: N/A;    Prior to Admission medications   Medication Sig Start Date End Date Taking? Authorizing Provider  busPIRone (BUSPAR) 5 MG tablet START TAKING 1 TABLET BY MOUTH ONCE DAILY THEN INCREASE TO TWICE A DAY AS NEEDED Patient taking differently: Take 5 mg by mouth as needed. START TAKING 1 TABLET BY MOUTH ONCE DAILY THEN INCREASE TO TWICE A DAY AS  NEEDED 01/13/23  Yes Nafziger, Kandee Keen, NP  levothyroxine (SYNTHROID) 25 MCG tablet TAKE 1 TABLET BY MOUTH DAILY BEFORE BREAKFAST. 12/16/22  Yes Nafziger, Kandee Keen, NP  Multiple Vitamin (MULTIVITAMIN WITH MINERALS) TABS tablet Take 1 tablet by mouth daily.   Yes [provider]    Current Outpatient Medications  Medication Sig Dispense Refill   busPIRone (BUSPAR) 5 MG tablet START TAKING 1 TABLET BY MOUTH ONCE DAILY THEN INCREASE TO TWICE A DAY AS NEEDED (Patient taking differently: Take 5 mg by mouth as needed. START TAKING 1 TABLET BY MOUTH ONCE DAILY THEN INCREASE TO TWICE A DAY AS NEEDED) 180 tablet 1   levothyroxine (SYNTHROID) 25 MCG tablet TAKE 1 TABLET BY MOUTH DAILY BEFORE BREAKFAST. 30 tablet 11   Multiple Vitamin (MULTIVITAMIN WITH MINERALS) TABS tablet Take 1 tablet by mouth daily.     Current Facility-Administered Medications  Medication Dose Route Frequency Provider Last Rate Last Admin   0.9 %  sodium chloride infusion  500 mL Intravenous Once Imogene Burn, MD        Allergies as of 07/23/2023   (No Known Allergies)    Family History  Problem Relation Age of Onset   Hypothyroidism Mother    Atrial fibrillation Father    Von Willebrand disease Brother    Stroke Maternal Grandfather    Heart attack Paternal Grandfather     Social History   Socioeconomic History   Marital status: Married    Spouse name: Not on file   Number of children: Not on file  Years of education: Not on file   Highest education level: Not on file  Occupational History   Not on file  Tobacco Use   Smoking status: Never   Smokeless tobacco: Never  Vaping Use   Vaping status: Never Used  Substance and Sexual Activity   Alcohol use: Yes   Drug use: No   Sexual activity: Yes    Partners: Male  Other Topics Concern   Not on file  Social History Narrative   Not on file   Social Drivers of Health   Financial Resource Strain: Not on file  Food Insecurity: Not on file  Transportation  Needs: Not on file  Physical Activity: Not on file  Stress: Not on file  Social Connections: Not on file  Intimate Partner Violence: Not on file    Physical Exam: Vital signs in last 24 hours: BP 110/72   Pulse 61   Temp 98.5 F (36.9 C)   Ht 5\' 1"  (1.549 m)   Wt 123 lb (55.8 kg)   SpO2 100%   BMI 23.24 kg/m  GEN: NAD EYE: Sclerae anicteric ENT: MMM CV: Non-tachycardic Pulm: No increased WOB GI: Soft NEURO:  Alert & Oriented   Eulah Pont, MD [facility]   07/23/2023 4:12 PM

## 2023-07-23 NOTE — Patient Instructions (Addendum)
 Await pathology Continue your normal medications Please read over handouts provided   YOU HAD AN ENDOSCOPIC PROCEDURE TODAY AT THE [facility]:   Refer to the procedure report that was given to you for any specific questions about what was found during the examination.  If the procedure report does not answer your questions, please call your gastroenterologist to clarify.  If you requested that your care partner not be given the details of your procedure findings, then the procedure report has been included in a sealed envelope for you to review at your convenience later.  YOU SHOULD EXPECT: Some feelings of bloating in the abdomen. Passage of more gas than usual.  Walking can help get rid of the air that was put into your GI tract during the procedure and reduce the bloating. If you had a lower endoscopy (such as a colonoscopy or flexible sigmoidoscopy) you may notice spotting of blood in your stool or on the toilet paper. If you underwent a bowel prep for your procedure, you may not have a normal bowel movement for a few days.  Please Note:  You might notice some irritation and congestion in your nose or some drainage.  This is from the oxygen used during your procedure.  There is no need for concern and it should clear up in a day or so.  SYMPTOMS TO REPORT IMMEDIATELY:  Following lower endoscopy (colonoscopy or flexible sigmoidoscopy):  Excessive amounts of blood in the stool  Significant tenderness or worsening of abdominal pains  Swelling of the abdomen that is new, acute  Fever of 100F or higher  For urgent or emergent issues, a gastroenterologist can be reached at any hour by calling [phone]. Do not use MyChart messaging for urgent concerns.    DIET:  We do recommend a small meal at first, but then you may proceed to your regular diet.  Drink plenty of fluids but you should avoid alcoholic beverages for 24 hours.  ACTIVITY:  You should plan to take it easy for  the rest of today and you should NOT DRIVE or use heavy machinery until tomorrow (because of the sedation medicines used during the test).    FOLLOW UP: Our staff will call the number listed on your records the next business day following your procedure.  We will call around 7:15- 8:00 am to check on you and address any questions or concerns that you may have regarding the information given to you following your procedure. If we do not reach you, we will leave a message.     If any biopsies were taken you will be contacted by phone or by letter within the next 1-3 weeks.  Please call us at [phone] if you have not heard about the biopsies in 3 weeks.    SIGNATURES/CONFIDENTIALITY: You and/or your care partner have signed paperwork which will be entered into your electronic medical record.  These signatures attest to the fact that that the information above on your After Visit Summary has been reviewed and is understood.  Full responsibility of the confidentiality of this discharge information lies with you and/or your care-partner.

## 2023-07-24 ENCOUNTER — Telehealth: Payer: Self-pay

## 2023-07-24 NOTE — Telephone Encounter (Signed)
 Follow up call to pt, lm for pt to call if having any difficulty with normal activities or eating and drinking.  Also to call if any other questions or concerns.

## 2023-07-28 ENCOUNTER — Encounter: Payer: Self-pay | Admitting: Internal Medicine

## 2023-07-28 LAB — SURGICAL PATHOLOGY

## 2023-10-26 ENCOUNTER — Other Ambulatory Visit: Payer: Self-pay | Admitting: Adult Health

## 2023-10-26 DIAGNOSIS — E039 Hypothyroidism, unspecified: Secondary | ICD-10-CM

## 2024-01-18 DIAGNOSIS — Z1329 Encounter for screening for other suspected endocrine disorder: Secondary | ICD-10-CM | POA: Diagnosis not present

## 2024-01-18 DIAGNOSIS — Z130 Encounter for screening for diseases of the blood and blood-forming organs and certain disorders involving the immune mechanism: Secondary | ICD-10-CM | POA: Diagnosis not present

## 2024-01-18 DIAGNOSIS — Z1231 Encounter for screening mammogram for malignant neoplasm of breast: Secondary | ICD-10-CM | POA: Diagnosis not present

## 2024-01-18 DIAGNOSIS — Z13220 Encounter for screening for lipoid disorders: Secondary | ICD-10-CM | POA: Diagnosis not present

## 2024-01-18 DIAGNOSIS — Z01419 Encounter for gynecological examination (general) (routine) without abnormal findings: Secondary | ICD-10-CM | POA: Diagnosis not present

## 2024-01-27 ENCOUNTER — Telehealth: Payer: Self-pay

## 2024-01-27 ENCOUNTER — Encounter: Payer: Self-pay | Admitting: Adult Health

## 2024-01-27 NOTE — Telephone Encounter (Signed)
 Spoke to pt and advised to send in the results via mychart. Pt is already scheduled for CPE. Please advise

## 2024-01-27 NOTE — Telephone Encounter (Signed)
 Copied from CRM [id number]. Topic: Clinical - Medical Advice >> Jan 27, 2024  1:51 PM Pinkey ORN wrote: Reason for CRM: Medical Question >> Jan 27, 2024  1:54 PM Pinkey ORN wrote: Patient states she has a prescription for levothyroxine  (SYNTHROID ) 25 MCG tablet and was told by her Gynecologist that her TSH levels were high and was advised that her levothyroxine  (SYNTHROID ) 25 MCG tablet should be increased. Please follow up with patient on this.

## 2024-01-28 ENCOUNTER — Other Ambulatory Visit: Payer: Self-pay | Admitting: Adult Health

## 2024-01-28 ENCOUNTER — Encounter: Payer: Self-pay | Admitting: Adult Health

## 2024-01-28 MED ORDER — LEVOTHYROXINE SODIUM 50 MCG PO TABS: 50.0000 ug | ORAL_TABLET | Freq: Every day | ORAL | 0 refills | Status: DC

## 2024-01-28 NOTE — Telephone Encounter (Signed)
 Please see mychart message before this one for understanding.

## 2024-02-19 ENCOUNTER — Ambulatory Visit (INDEPENDENT_AMBULATORY_CARE_PROVIDER_SITE_OTHER): Admitting: Adult Health

## 2024-02-19 ENCOUNTER — Ambulatory Visit: Payer: Self-pay | Admitting: Adult Health

## 2024-02-19 ENCOUNTER — Other Ambulatory Visit: Payer: Self-pay | Admitting: Adult Health

## 2024-02-19 VITALS — BP 110/60 | HR 68 | Temp 97.7°F | Ht 61.0 in | Wt 123.0 lb

## 2024-02-19 DIAGNOSIS — Z0000 Encounter for general adult medical examination without abnormal findings: Secondary | ICD-10-CM | POA: Diagnosis not present

## 2024-02-19 DIAGNOSIS — Z23 Encounter for immunization: Secondary | ICD-10-CM | POA: Diagnosis not present

## 2024-02-19 DIAGNOSIS — E039 Hypothyroidism, unspecified: Secondary | ICD-10-CM

## 2024-02-19 DIAGNOSIS — F411 Generalized anxiety disorder: Secondary | ICD-10-CM | POA: Diagnosis not present

## 2024-02-19 LAB — TSH: TSH: 2.79 u[IU]/mL (ref 0.35–5.50)

## 2024-02-19 MED ORDER — LEVOTHYROXINE SODIUM 50 MCG PO TABS: 50.0000 ug | ORAL_TABLET | Freq: Every day | ORAL | 3 refills | Status: AC

## 2024-02-19 MED ORDER — BUSPIRONE HCL 5 MG PO TABS: 5.0000 mg | ORAL_TABLET | Freq: Two times a day (BID) | ORAL | 1 refills | Status: AC

## 2024-02-19 NOTE — Progress Notes (Signed)
 Subjective:    Patient ID: Ashley Mendez, female    DOB: [birth year], 43 y.o.   MRN: [id number]  HPI Patient presents for yearly preventative medicine examination. She is a pleasant 43 year old female who  has a past medical history of GAD (generalized anxiety disorder), HSV (herpes simplex virus) anogenital infection, varicella, Medical history non-contributory, PAC (premature atrial contraction) (01/26/2017), Palpitation (12/10/2016), PVC (premature ventricular contraction) (01/26/2017), and Thyroid  disease.  Hypothyroidism-she recently had blood work at [facility] which showed an elevated TSH of 7.4 - at this time we increased her Synthroid  dose from 25 to 50 mcg  Lab Results  Component Value Date   TSH 4.45 12/03/2022   Anxiety- managed with Buspar  5mg  PRN and feels like this is controlled in regards to anxiety for the most part after she takes her dose but still feels easily annoyed or agitated. She is unsure if this is hormonal, stress from work and life or another causative factor.     02/19/2024   10:14 AM 06/15/2020   10:04 AM  GAD 7 : Generalized Anxiety Score  Nervous, Anxious, on Edge 2 1  Control/stop worrying 0 1  Worry too much - different things 0 0  Trouble relaxing 1 0  Restless 0 0  Easily annoyed or irritable 2 2  Afraid - awful might happen 1 1  Total GAD 7 Score 6 5  Anxiety Difficulty Somewhat difficult Not difficult at all    Flowsheet Row Office Visit from 02/19/2024 in [facility]  PHQ-9 Total Score 1      All immunizations and health maintenance protocols were reviewed with the patient and needed orders were placed.  Appropriate screening laboratory values were ordered for the patient including screening of hyperlipidemia, renal function and hepatic function.  Medication reconciliation,  past medical history, social history, problem list and allergies were reviewed in detail with the patient  Goals were established with  regard to weight loss, exercise, and  diet in compliance with medications. She continues to eat healthy and exercise on a routine basis.   She is up to date on GYN care  She did have a colonoscopy this year due to some ongoing stomach issues. Her colonoscopy showed two sessile polyps and it is recommended she have a repeat colonoscopy in 7 years. She has been taking Metamucil and Miralax daily and has found that this helped with her stomach issues and is no longer having pain.   Review of Systems  Constitutional: Negative.   HENT: Negative.    Eyes: Negative.   Respiratory: Negative.    Cardiovascular: Negative.   Gastrointestinal: Negative.   Endocrine: Negative.   Genitourinary: Negative.   Musculoskeletal: Negative.   Skin: Negative.   Allergic/Immunologic: Negative.   Neurological: Negative.   Hematological: Negative.   Psychiatric/Behavioral: Negative.     Past Medical History:  Diagnosis Date   GAD (generalized anxiety disorder)    HSV (herpes simplex virus) anogenital infection    Hx of varicella    Medical history non-contributory    PAC (premature atrial contraction) 01/26/2017   Palpitation 12/10/2016   PVC (premature ventricular contraction) 01/26/2017   Thyroid  disease     Social History   Socioeconomic History   Marital status: Married    Spouse name: Not on file   Number of children: Not on file   Years of education: Not on file   Highest education level: Not on file  Occupational History   Not  on file  Tobacco Use   Smoking status: Never   Smokeless tobacco: Never  Vaping Use   Vaping status: Never Used  Substance and Sexual Activity   Alcohol use: Yes   Drug use: No   Sexual activity: Yes    Partners: Male  Other Topics Concern   Not on file  Social History Narrative   Not on file   Social Drivers of Health   Financial Resource Strain: Not on file  Food Insecurity: Not on file  Transportation Needs: Not on file  Physical Activity: Not on  file  Stress: Not on file  Social Connections: Not on file  Intimate Partner Violence: Not on file    Past Surgical History:  Procedure Laterality Date   CESAREAN SECTION N/A 12/06/2014   Procedure: CESAREAN SECTION;  Surgeon: Jolene Gaskins, MD;  Location: [facility];  Service: Obstetrics;  Laterality: N/A;   CESAREAN SECTION N/A 11/29/2015   Procedure: REPEAT CESAREAN SECTION, TWO DESSEL CORDS;  Surgeon: Jolene Gaskins, MD;  Location: [facility];  Service: Obstetrics;  Laterality: N/A;    Family History  Problem Relation Age of Onset   Hypothyroidism Mother    Atrial fibrillation Father    Von Willebrand disease Brother    Stroke Maternal Grandfather    Heart attack Paternal Grandfather     No Known Allergies  Current Outpatient Medications on File Prior to Visit  Medication Sig Dispense Refill   levothyroxine  (SYNTHROID ) 50 MCG tablet Take 1 tablet (50 mcg total) by mouth daily. 30 tablet 0   Multiple Vitamin (MULTIVITAMIN WITH MINERALS) TABS tablet Take 1 tablet by mouth daily.     YUVAFEM 10 MCG TABS vaginal tablet 10 mcg.     No current facility-administered medications on file prior to visit.    BP 110/60   Pulse 68   Temp 97.7 F (36.5 C)   Ht 5' 1 (1.549 m)   Wt 123 lb (55.8 kg)   SpO2 98%   BMI 23.24 kg/m       Objective:   Physical Exam Vitals and nursing note reviewed.  Constitutional:      General: She is not in acute distress.    Appearance: Normal appearance. She is not ill-appearing.  HENT:     Head: Normocephalic and atraumatic.     Right Ear: Tympanic membrane, ear canal and external ear normal. There is no impacted cerumen.     Left Ear: Tympanic membrane, ear canal and external ear normal. There is no impacted cerumen.     Nose: Nose normal. No congestion or rhinorrhea.     Mouth/Throat:     Mouth: Mucous membranes are moist.     Pharynx: Oropharynx is clear.  Eyes:     Extraocular Movements: Extraocular movements intact.      Conjunctiva/sclera: Conjunctivae normal.     Pupils: Pupils are equal, round, and reactive to light.  Neck:     Vascular: No carotid bruit.  Cardiovascular:     Rate and Rhythm: Normal rate and regular rhythm.     Pulses: Normal pulses.     Heart sounds: No murmur heard.    No friction rub. No gallop.  Pulmonary:     Effort: Pulmonary effort is normal.     Breath sounds: Normal breath sounds.  Abdominal:     General: Abdomen is flat. Bowel sounds are normal. There is no distension.     Palpations: Abdomen is soft. There is no mass.  Tenderness: There is no abdominal tenderness. There is no guarding or rebound.     Hernia: No hernia is present.  Musculoskeletal:        General: Normal range of motion.     Cervical back: Normal range of motion and neck supple.  Lymphadenopathy:     Cervical: No cervical adenopathy.  Skin:    General: Skin is warm and dry.     Capillary Refill: Capillary refill takes less than 2 seconds.  Neurological:     General: No focal deficit present.     Mental Status: She is alert and oriented to person, place, and time.  Psychiatric:        Mood and Affect: Mood normal.        Behavior: Behavior normal.        Thought Content: Thought content normal.        Judgment: Judgment normal.       Assessment & Plan:  1. Routine general medical examination at a health care facility (Primary) Today patient counseled on age appropriate routine health concerns for screening and prevention, each reviewed and up to date or declined. Immunizations reviewed and up to date or declined. Labs ordered and reviewed. Risk factors for depression reviewed and negative. Hearing function and visual acuity are intact. ADLs screened and addressed as needed. Functional ability and level of safety reviewed and appropriate. Education, counseling and referrals performed based on assessed risks today. Patient provided with a copy of personalized plan for preventive services. - Stay  active and eat healthy  - Follow up in one year or sooner if needed   2. Acquired hypothyroidism - Consider dose change  - TSH; Future - TSH  3. GAD (generalized anxiety disorder) - Will have her try taking Buspar  daily to see if this helps with anxiety and agitation   4. Need for hepatitis vaccination  - Heplisav-B (HepB-CPG) Vaccine  5. Need for HPV vaccine  - HPV 9-valent vaccine,Recombinat  Darleene Shape, NP

## 2024-04-18 ENCOUNTER — Ambulatory Visit: Payer: Self-pay

## 2024-04-18 DIAGNOSIS — H6692 Otitis media, unspecified, left ear: Secondary | ICD-10-CM | POA: Diagnosis not present

## 2024-04-18 DIAGNOSIS — J029 Acute pharyngitis, unspecified: Secondary | ICD-10-CM | POA: Diagnosis not present

## 2024-04-18 NOTE — Telephone Encounter (Signed)
 FYI Only or Action Required?: FYI only for provider: going to urgent care this evening for strep swab.  Patient was last seen in primary care on 02/19/2024 by Merna Huxley, NP.  Called Nurse Triage reporting Sore Throat and Nasal Congestion.  Symptoms began today.  Interventions attempted: Nothing.  Symptoms are: stable.  Triage Disposition: Home Care  Patient/caregiver understands and will follow disposition?: Yes            Copied from CRM #[id number]. Topic: Clinical - Red Word Triage >> Apr 18, 2024 12:58 PM Ashley Mendez wrote: Red Word that prompted transfer to Nurse Triage: really bad sore throat getting worse, slight congested Reason for Disposition  [1] Sore throat with cough/cold symptoms AND [2] present < 5 days  Answer Assessment - Initial Assessment Questions 1. ONSET: When did the throat start hurting? (Hours or days ago)      This morning.  2. SEVERITY: How bad is the sore throat? (Scale 1-10; mild, moderate or severe)     4-5/10. Sharp and hurts to swallow or eat.  3. STREP EXPOSURE: Has there been any exposure to strep within the past week? If Yes, ask: What type of contact occurred?      No.  4.  VIRAL SYMPTOMS: Are there any symptoms of a cold, such as a runny nose, cough, hoarse voice or red eyes?      Sneezing, post nasal drip, nasal congestion. No cough.  5. FEVER: Do you have a fever? If Yes, ask: What is your temperature, how was it measured, and when did it start?     No fever.  6. PUS ON THE TONSILS: Is there pus on the tonsils in the back of your throat?     Unsure. She states she tried looking and couldn't see much.  7. OTHER SYMPTOMS: Do you have any other symptoms? (e.g., difficulty breathing, headache, rash)     No drooling/difficulty swallowing, difficulty breathing, swollen lymph nodes.  8. PREGNANCY: Is there any chance you are pregnant? When was your last menstrual period?     LMP: 2 weeks ago.  Protocols  used: Sore Throat-A-AH
